# Patient Record
Sex: Female | Born: 1995 | Race: White | Hispanic: Yes | Marital: Married | Smoking: Former smoker
Health system: Southern US, Community
[De-identification: ages and names within clinical notes are randomized; demographics above are authoritative.]

## PROBLEM LIST (undated history)

## (undated) DIAGNOSIS — Z789 Other specified health status: Secondary | ICD-10-CM

## (undated) DIAGNOSIS — Z87440 Personal history of urinary (tract) infections: Secondary | ICD-10-CM

## (undated) DIAGNOSIS — A159 Respiratory tuberculosis unspecified: Secondary | ICD-10-CM

## (undated) HISTORY — DX: Personal history of urinary (tract) infections: Z87.440

## (undated) HISTORY — DX: Respiratory tuberculosis unspecified: A15.9

## (undated) HISTORY — DX: Other specified health status: Z78.9

## (undated) HISTORY — PX: OTHER SURGICAL HISTORY: SHX169

---

## 2019-01-27 ENCOUNTER — Other Ambulatory Visit: Payer: Self-pay | Admitting: Family Medicine

## 2019-01-27 ENCOUNTER — Ambulatory Visit (LOCAL_COMMUNITY_HEALTH_CENTER): Payer: Medicaid Other

## 2019-01-27 ENCOUNTER — Other Ambulatory Visit: Payer: Self-pay

## 2019-01-27 VITALS — BP 95/60 | Ht 64.0 in | Wt 143.0 lb

## 2019-01-27 DIAGNOSIS — Z3201 Encounter for pregnancy test, result positive: Secondary | ICD-10-CM

## 2019-01-27 LAB — PREGNANCY, URINE: Preg Test, Ur: POSITIVE — AB

## 2019-01-27 MED ORDER — PRENATAL VITAMINS 28-0.8 MG PO TABS: 28.0000 mg | ORAL_TABLET | Freq: Every day | ORAL | 0 refills | Status: DC

## 2019-01-27 NOTE — Progress Notes (Signed)
Patient reports abdominal pain since 3rd month of pregnancy.  Co to go to ED if pain worsened or other sx's arise. Patient sent to clerk to schedule MH IP. Aileen Fass, RN   No NCIR found

## 2019-02-04 ENCOUNTER — Ambulatory Visit: Payer: Medicaid Other | Admitting: Nurse Practitioner

## 2019-02-04 ENCOUNTER — Encounter: Payer: Self-pay | Admitting: Nurse Practitioner

## 2019-02-04 ENCOUNTER — Other Ambulatory Visit: Payer: Self-pay

## 2019-02-04 VITALS — BP 95/62 | Temp 97.9°F | Wt 142.6 lb

## 2019-02-04 DIAGNOSIS — Z3402 Encounter for supervision of normal first pregnancy, second trimester: Secondary | ICD-10-CM

## 2019-02-04 DIAGNOSIS — O0990 Supervision of high risk pregnancy, unspecified, unspecified trimester: Secondary | ICD-10-CM | POA: Diagnosis not present

## 2019-02-04 DIAGNOSIS — O0932 Supervision of pregnancy with insufficient antenatal care, second trimester: Secondary | ICD-10-CM

## 2019-02-04 LAB — WET PREP FOR TRICH, YEAST, CLUE: Trichomonas Exam: NEGATIVE

## 2019-02-04 LAB — URINALYSIS
Bilirubin, UA: NEGATIVE
Glucose, UA: NEGATIVE
Ketones, UA: NEGATIVE
Leukocytes,UA: NEGATIVE
Nitrite, UA: NEGATIVE
Protein,UA: NEGATIVE
RBC, UA: NEGATIVE
Specific Gravity, UA: 1.02 (ref 1.005–1.030)
Urobilinogen, Ur: 0.2 mg/dL (ref 0.2–1.0)
pH, UA: 7.5 (ref 5.0–7.5)

## 2019-02-04 LAB — HEMOGLOBIN, FINGERSTICK: Hemoglobin: 11.6 g/dL (ref 11.1–15.9)

## 2019-02-04 LAB — OB RESULTS CONSOLE HIV ANTIBODY (ROUTINE TESTING): HIV: NONREACTIVE

## 2019-02-04 NOTE — Progress Notes (Signed)
Presents for initiation of prenatal care. Reports in [place] x2 months from [place]. FOB remains in [place]. Negative responses to Covid-19 screening questions.Urine dip WNL and hgb = 11.6 meso interventions needed per standing order. [place] referral faxed to [facility] with confirmation received. Shona Needles, RN

## 2019-02-04 NOTE — Progress Notes (Signed)
[place] [id number] [phone]  INITIAL PRENATAL VISIT NOTE  Subjective:  Crystal Wright is a 23 y.o. G2P1001 at [redacted]w[redacted]d being seen today to start prenatal care at the [facility].  She is currently monitored for the following issues for this high-risk pregnancy and has Supervision of high risk pregnancy, antepartum and Insufficient prenatal care on their problem list.  Patient reports no complaints.  Contractions: Not present. Vag. Bleeding: None.  Movement: Present. Denies leaking of fluid.   The following portions of the patient's history were reviewed and updated as appropriate: allergies, current medications, past family history, past medical history, past social history, past surgical history and problem list. Problem list updated.  Objective:   Vitals:   02/04/19 1346  BP: 95/62  Temp: 97.9 F (36.6 C)  Weight: 142 lb 9.6 oz (64.7 kg)    Fetal Status: Fetal Heart Rate (bpm): 144 Fundal Height: 23 cm Movement: Present  Presentation: Brow  Physical Exam Vitals signs and nursing note reviewed.  Constitutional:      Appearance: Normal appearance.  HENT:     Head: Normocephalic.     Right Ear: Hearing normal.     Left Ear: Hearing normal.     Nose: Nose normal.     Mouth/Throat:     Lips: Pink.     Mouth: Mucous membranes are moist.  Eyes:     General: Lids are normal.     Extraocular Movements: Extraocular movements intact.  Neck:     Musculoskeletal: Full passive range of motion without pain, normal range of motion and neck supple.  Cardiovascular:     Rate and Rhythm: Normal rate and regular rhythm.     Pulses: Normal pulses.     Heart sounds: Normal heart sounds.  Pulmonary:     Effort: Pulmonary effort is normal.     Breath sounds: Normal breath sounds.     Comments: CBE completed - discussed monthly SBE's Client plans to  breastfeed/formula feed. Abdominal:     Palpations: Abdomen is soft.     Comments: Client pregnant -  ~ [redacted]wks  gestation   Genitourinary:    Rectum: Normal.  Musculoskeletal: Normal range of motion.     Right lower leg: No edema.     Left lower leg: No edema.  Lymphadenopathy:     Cervical: No cervical adenopathy.  Skin:    General: Skin is warm and dry.  Neurological:     Mental Status: She is alert and oriented to person, place, and time.  Psychiatric:        Attention and Perception: Attention normal.        Mood and Affect: Mood normal.        Speech: Speech normal.        Behavior: Behavior normal. Behavior is cooperative.        Cognition and Memory: Cognition and memory normal.    2 FB above umbilicus 259 FHR   Assessment and Plan:  Pregnancy: G2P1001 at [redacted]w[redacted]d  1. Encounter for supervision of normal first pregnancy in second trimester Client doing well  - WET PREP FOR [place], YEAST, CLUE - Urinalysis (Urine Dip) - Hemoglobin, venipuncture - Pap IG (Image Guided) - Urine Culture - HIV [facility] - Comprehensive metabolic panel - Chlamydia/Gonococcus/Trichomonas, NAA - [id number] 11+Oxyco+Alc+Crt-Bund - Lead, blood (adult age 109 yrs or greater)  2. Insufficient prenatal care in  second trimester Continue to monitor    Discussed overview of care and coordination with inpatient delivery practices including [facility], [facility], [facility] and [facility].   Reviewed Centering pregnancy as standard of care at [facility], oriented to room and showed video. Based on EDD, plan for Cycle  - discussed Zoom pregnancy .    Preterm labor symptoms and general obstetric precautions including but not limited to vaginal bleeding, contractions, leaking of fluid and fetal movement were reviewed in detail with the patient.  Please refer to After Visit Summary for other counseling recommendations.   Return in about 2 weeks (around 02/18/2019) for telehealth.  Future Appointments   Date Time Provider Department Center  02/18/2019  2:00 PM [facility] PROVIDER [facility] None    Donn PieriniKarla W Jaleyah Longhi, NP

## 2019-02-04 NOTE — Progress Notes (Signed)
Quit smoking 4 years ago

## 2019-02-05 DIAGNOSIS — O0990 Supervision of high risk pregnancy, unspecified, unspecified trimester: Secondary | ICD-10-CM | POA: Insufficient documentation

## 2019-02-05 DIAGNOSIS — O0930 Supervision of pregnancy with insufficient antenatal care, unspecified trimester: Secondary | ICD-10-CM | POA: Insufficient documentation

## 2019-02-06 LAB — 789231 7+OXYCODONE-BUND
Amphetamines, Urine: NEGATIVE ng/mL
BENZODIAZ UR QL: NEGATIVE ng/mL
Barbiturate screen, urine: NEGATIVE ng/mL
Cannabinoid Quant, Ur: NEGATIVE ng/mL
Cocaine (Metab.): NEGATIVE ng/mL
OPIATE SCREEN URINE: NEGATIVE ng/mL
Oxycodone/Oxymorphone, Urine: NEGATIVE ng/mL
PCP Quant, Ur: NEGATIVE ng/mL

## 2019-02-06 LAB — URINE CULTURE: Organism ID, Bacteria: NO GROWTH

## 2019-02-07 ENCOUNTER — Telehealth: Payer: Self-pay

## 2019-02-07 LAB — QUANTIFERON-TB GOLD PLUS
QuantiFERON Mitogen Value: 7.54 IU/mL
QuantiFERON Nil Value: 0.02 IU/mL
QuantiFERON TB1 Ag Value: 6.2 IU/mL
QuantiFERON TB2 Ag Value: 5.94 IU/mL
QuantiFERON-TB Gold Plus: POSITIVE — AB

## 2019-02-07 LAB — CBC/D/PLT+RPR+RH+ABO+RUB AB...
Antibody Screen: NEGATIVE
Basophils Absolute: 0 10*3/uL (ref 0.0–0.2)
Basos: 0 %
EOS (ABSOLUTE): 0.1 10*3/uL (ref 0.0–0.4)
Eos: 1 %
Hematocrit: 33.8 % — ABNORMAL LOW (ref 34.0–46.6)
Hemoglobin: 11.6 g/dL (ref 11.1–15.9)
Hepatitis B Surface Ag: NEGATIVE
Immature Grans (Abs): 0.1 10*3/uL (ref 0.0–0.1)
Immature Granulocytes: 1 %
Lymphocytes Absolute: 1.4 10*3/uL (ref 0.7–3.1)
Lymphs: 16 %
MCH: 31.3 pg (ref 26.6–33.0)
MCHC: 34.3 g/dL (ref 31.5–35.7)
MCV: 91 fL (ref 79–97)
Monocytes Absolute: 0.7 10*3/uL (ref 0.1–0.9)
Monocytes: 8 %
Neutrophils Absolute: 6.8 10*3/uL (ref 1.4–7.0)
Neutrophils: 74 %
Platelets: 246 10*3/uL (ref 150–450)
RBC: 3.71 x10E6/uL — ABNORMAL LOW (ref 3.77–5.28)
RDW: 13.1 % (ref 11.7–15.4)
RPR Ser Ql: NONREACTIVE
Rh Factor: POSITIVE
Rubella Antibodies, IGG: 1.59 index (ref >0.99)
Varicella zoster IgG: 306 index (ref >165)
WBC: 9.1 10*3/uL (ref 3.4–10.8)

## 2019-02-07 LAB — HGB FRAC. W/SOLUBILITY
Hgb A2 Quant: 2.6 % (ref 1.8–3.2)
Hgb A: 97.4 % (ref 96.4–98.8)
Hgb C: 0 %
Hgb F Quant: 0 % (ref 0.0–2.0)
Hgb S: 0 %
Hgb Solubility: NEGATIVE
Hgb Variant: 0 %

## 2019-02-07 LAB — COMPREHENSIVE METABOLIC PANEL
ALT: 10 IU/L (ref 0–32)
AST: 13 IU/L (ref 0–40)
Albumin/Globulin Ratio: 1.7 (ref 1.2–2.2)
Albumin: 3.7 g/dL — ABNORMAL LOW (ref 3.9–5.0)
Alkaline Phosphatase: 59 IU/L (ref 39–117)
BUN/Creatinine Ratio: 10 (ref 9–23)
BUN: 5 mg/dL — ABNORMAL LOW (ref 6–20)
Bilirubin Total: <0.2 mg/dL (ref 0.0–1.2)
CO2: 19 mmol/L — ABNORMAL LOW (ref 20–29)
Calcium: 8.7 mg/dL (ref 8.7–10.2)
Chloride: 105 mmol/L (ref 96–106)
Creatinine, Ser: 0.48 mg/dL — ABNORMAL LOW (ref 0.57–1.00)
GFR calc Af Amer: 162 mL/min/{1.73_m2} (ref >59)
GFR calc non Af Amer: 141 mL/min/{1.73_m2} (ref >59)
Globulin, Total: 2.2 g/dL (ref 1.5–4.5)
Glucose: 80 mg/dL (ref 65–99)
Potassium: 4.1 mmol/L (ref 3.5–5.2)
Sodium: 138 mmol/L (ref 134–144)
Total Protein: 5.9 g/dL — ABNORMAL LOW (ref 6.0–8.5)

## 2019-02-07 LAB — LEAD, BLOOD (ADULT >= 16 YRS): Lead-Whole Blood: 3 ug/dL (ref 0–4)

## 2019-02-09 LAB — PAP IG (IMAGE GUIDED): PAP Smear Comment: 0

## 2019-02-10 LAB — CHLAMYDIA/GC NAA, CONFIRMATION
Chlamydia trachomatis, NAA: NEGATIVE
Neisseria gonorrhoeae, NAA: NEGATIVE

## 2019-02-18 ENCOUNTER — Ambulatory Visit: Payer: Medicaid Other | Admitting: Nurse Practitioner

## 2019-02-18 ENCOUNTER — Other Ambulatory Visit: Payer: Self-pay

## 2019-02-18 DIAGNOSIS — O0932 Supervision of pregnancy with insufficient antenatal care, second trimester: Secondary | ICD-10-CM

## 2019-02-18 DIAGNOSIS — O0990 Supervision of high risk pregnancy, unspecified, unspecified trimester: Secondary | ICD-10-CM

## 2019-02-18 NOTE — Progress Notes (Signed)
TC to patient for maternity telehealth appointment. Patient at 5 1/7 today. Patient identified using 2 identifiers, and states she is at home and will be available for provider call for next 30-45 minutes. Patient scheduled for 28 week labs next visit. Explained to patient about routine prenatal labs and that she can expect to be in clinic for a little over 1 hour. Patient agrees to plans and is waiting for provider call.Jenetta Downer, RN

## 2019-02-18 NOTE — Progress Notes (Signed)
   TELEPHONE OBSTETRICS VISIT ENCOUNTER NOTE  I connected with@ on 02/18/19 at  2:00 PM EDT by telephone at home and verified that I am speaking with the correct person using two identifiers.   I discussed the limitations, risks, security and privacy concerns of performing an evaluation and management service by telephone and the availability of in person appointments. I also discussed with the patient that there may be a patient responsible charge related to this service. The patient expressed understanding and agreed to proceed.  Subjective:  Crystal Wright is a 23 y.o. G2P1001 at [redacted]w[redacted]d being followed for ongoing prenatal care.  She is currently monitored for the following issues for this high-risk pregnancy and has Supervision of high risk pregnancy, antepartum and Insufficient prenatal care on their problem list.  Patient reports c/o right "belly pain" since 3 months pregnant intermittently x 15-30 min alleviated with lying down, aggrevated with walking. Last pain yesterday and resolved spontaneously. Denies bleeding, denies SROM. Reports fetal movement. Denies any contractions, bleeding or leaking of fluid.   The following portions of the patient's history were reviewed and updated as appropriate: allergies, current medications, past family history, past medical history, past social history, past surgical history and problem list.   Objective:   General:  Alert, oriented and cooperative.   Mental Status: Normal mood and affect perceived. Normal judgment and thought content.  Rest of physical exam deferred due to type of encounter  Assessment and Plan:  Pregnancy: G2P1001 at [redacted]w[redacted]d 1. Supervision of high risk pregnancy, antepartum   Preterm labor symptoms and general obstetric precautions including but not limited to vaginal bleeding, contractions, leaking of fluid and fetal movement were reviewed in detail with the patient.  I discussed the assessment and treatment plan with the  patient. The patient was provided an opportunity to ask questions and all were answered. The patient agreed with the plan and demonstrated an understanding of the instructions. The patient was advised to call back or seek an in-person office evaluation/go to the hospital for any urgent or concerning symptoms.  Please refer to After Visit Summary for other counseling recommendations.   I provided 12 minutes of non-face-to-face time during this encounter.  No follow-ups on file.  Future Appointments  Date Time Provider [place]  03/14/2019 10:40 AM [facility] PROVIDER [facility] None    Herbie Saxon, CNM

## 2019-02-25 ENCOUNTER — Ambulatory Visit (LOCAL_COMMUNITY_HEALTH_CENTER): Payer: Medicaid Other

## 2019-02-25 VITALS — Wt 142.9 lb

## 2019-02-25 DIAGNOSIS — R7612 Nonspecific reaction to cell mediated immunity measurement of gamma interferon antigen response without active tuberculosis: Secondary | ICD-10-CM | POA: Insufficient documentation

## 2019-02-25 HISTORY — DX: Nonspecific reaction to cell mediated immunity measurement of gamma interferon antigen response without active tuberculosis: R76.12

## 2019-02-25 NOTE — Telephone Encounter (Signed)
TC with patient re: +QFT. See EPI telephone visit from 02/25/19 Aileen Fass, RN

## 2019-02-25 NOTE — Progress Notes (Signed)
Discussed +QFT with patient.  EPI completed completed via phone d/t covid 19. Discussed LTBI vs Active TB and medication. TB RN will f/u with patient once CXR result is complete. Aileen Fass, RN

## 2019-02-26 ENCOUNTER — Ambulatory Visit
Admission: RE | Admit: 2019-02-26 | Discharge: 2019-02-26 | Disposition: A | Discharge status: Home / Self Care | Payer: Self-pay | Source: Ambulatory Visit | Attending: Family Medicine | Admitting: Family Medicine

## 2019-02-26 ENCOUNTER — Other Ambulatory Visit: Payer: Self-pay | Admitting: Family Medicine

## 2019-02-26 DIAGNOSIS — R7612 Nonspecific reaction to cell mediated immunity measurement of gamma interferon antigen response without active tuberculosis: Secondary | ICD-10-CM

## 2019-02-27 ENCOUNTER — Other Ambulatory Visit: Payer: Self-pay

## 2019-02-27 ENCOUNTER — Telehealth: Payer: Self-pay

## 2019-02-27 DIAGNOSIS — R7612 Nonspecific reaction to cell mediated immunity measurement of gamma interferon antigen response without active tuberculosis: Secondary | ICD-10-CM

## 2019-02-27 MED ORDER — RIFAMPIN 300 MG PO CAPS: 600.0000 mg | ORAL_CAPSULE | Freq: Every day | ORAL | 3 refills | Status: DC

## 2019-02-27 NOTE — Progress Notes (Signed)
Tuberculosis treatment orders  All patients are to be monitored per [place] and county TB policies.   Patient has latent TB. 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 Ernestina Patches.

## 2019-02-27 NOTE — Telephone Encounter (Signed)
TC with patient re: normal CXR. Discussed LTBI tx and patient wishes to proceed with tx. Appt scheduled for 03/14/19 for Rifampin start. Aileen Fass, RN

## 2019-03-14 ENCOUNTER — Encounter: Payer: Self-pay | Admitting: Advanced Practice Midwife

## 2019-03-14 ENCOUNTER — Other Ambulatory Visit: Payer: Self-pay

## 2019-03-14 ENCOUNTER — Ambulatory Visit: Payer: Self-pay | Admitting: Advanced Practice Midwife

## 2019-03-14 ENCOUNTER — Ambulatory Visit (LOCAL_COMMUNITY_HEALTH_CENTER): Payer: Self-pay

## 2019-03-14 VITALS — Wt 150.0 lb

## 2019-03-14 VITALS — BP 92/55 | Temp 98.7°F | Wt 150.4 lb

## 2019-03-14 DIAGNOSIS — O0932 Supervision of pregnancy with insufficient antenatal care, second trimester: Secondary | ICD-10-CM

## 2019-03-14 DIAGNOSIS — R7612 Nonspecific reaction to cell mediated immunity measurement of gamma interferon antigen response without active tuberculosis: Secondary | ICD-10-CM

## 2019-03-14 DIAGNOSIS — O0990 Supervision of high risk pregnancy, unspecified, unspecified trimester: Secondary | ICD-10-CM

## 2019-03-14 LAB — HEMOGLOBIN, FINGERSTICK: Hemoglobin: 11.6 g/dL (ref 11.1–15.9)

## 2019-03-14 LAB — HIV ANTIBODY (ROUTINE TESTING W REFLEX): HIV 1&2 Ab, 4th Generation: NONREACTIVE

## 2019-03-14 NOTE — Progress Notes (Signed)
   PRENATAL VISIT NOTE  Subjective:  Crystal Wright is a 23 y.o. G2P1001 at [redacted]w[redacted]d being seen today for ongoing prenatal care.  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  Patient reports no complaints.  Contractions: Not present. Vag. Bleeding: None.  Movement: Present. Denies leaking of fluid/ROM.   The following portions of the patient's history were reviewed and updated as appropriate: allergies, current medications, past family history, past medical history, past social history, past surgical history and problem list. Problem list updated.  Objective:   Vitals:   03/14/19 1056  BP: (!) 92/55  Temp: 98.7 F (37.1 C)  Weight: 150 lb 6.4 oz (68.2 kg)    Fetal Status: Fetal Heart Rate (bpm): 150 Fundal Height: 26 cm Movement: Present     General:  Alert, oriented and cooperative. Patient is in no acute distress.  Skin: Skin is warm and dry. No rash noted.   Cardiovascular: Normal heart rate noted  Respiratory: Normal respiratory effort, no problems with respiration noted  Abdomen: Soft, gravid, appropriate for gestational age.  Pain/Pressure: Absent     Pelvic: Cervical exam deferred        Extremities: Normal range of motion.  Edema: None  Mental Status: Normal mood and affect. Normal behavior. Normal judgment and thought content.   Assessment and Plan:  Pregnancy: G2P1001 at [redacted]w[redacted]d  1. Supervision of high risk pregnancy, antepartum Feels well. Info given to assist with choosing peds (has no peds for 23 yo).  To begin LTBI tx per Aileen Fass, RN. 28 wk labs today.  Desires Nexplanon pp for birth control.   - Hepatic function panel - Hemoglobin, venipuncture - Glucose, 1 hour gestational - HIV [place]  [facility]  2. Insufficient prenatal care in second trimester    Preterm labor symptoms and general obstetric precautions including but not limited to vaginal bleeding, contractions, leaking of fluid and fetal movement were reviewed in detail with the patient. Please refer to After Visit Summary for other counseling recommendations.  Return in about 2 weeks (around 03/28/2019) for routine PNC.  No future appointments.  Herbie Saxon, CNM

## 2019-03-14 NOTE — Addendum Note (Signed)
Addended by: Jenetta Downer on: 03/14/2019 12:21 PM   Modules accepted: Orders

## 2019-03-14 NOTE — Progress Notes (Signed)
Patient Hgb reviewed, no treatment needed at this time. Tdap given, right deltoid, tolerated well, VIS given. Patient scheduled for 2 week telehealth appointment and given reminder card. Patient states understanding that she needs to be home and available at appointment time.Jenetta Downer, RN

## 2019-03-14 NOTE — Progress Notes (Signed)
Patient here for maternity visit at 28 4/7. Needs to be to lab by 11:50, and also needs to talk with M. Dorminy today about TB meds.Jenetta Downer, RN

## 2019-03-15 LAB — HEPATIC FUNCTION PANEL
ALT: 9 IU/L (ref 0–32)
AST: 13 IU/L (ref 0–40)
Albumin: 3.4 g/dL — ABNORMAL LOW (ref 3.9–5.0)
Alkaline Phosphatase: 91 IU/L (ref 39–117)
Bilirubin Total: <0.2 mg/dL (ref 0.0–1.2)
Bilirubin, Direct: 0.06 mg/dL (ref 0.00–0.40)
Total Protein: 5.7 g/dL — ABNORMAL LOW (ref 6.0–8.5)

## 2019-03-15 LAB — RPR: RPR Ser Ql: NONREACTIVE

## 2019-03-15 LAB — GLUCOSE, 1 HOUR GESTATIONAL: Gestational Diabetes Screen: 84 mg/dL (ref 65–139)

## 2019-03-19 NOTE — Progress Notes (Signed)
Rifampin 300mg #60 dispensed per [place] order. Elizabth Palka, RN  

## 2019-03-21 MED ORDER — RIFAMPIN 300 MG PO CAPS: 600.0000 mg | ORAL_CAPSULE | Freq: Every day | ORAL | 0 refills | Status: AC

## 2019-03-24 ENCOUNTER — Telehealth: Payer: Self-pay

## 2019-03-24 NOTE — Telephone Encounter (Signed)
TC from patient.  Reports nausea and vomiting since started taking Rifampin. Had [facility] appt on 03/14/19.  States pregnancy nausea had improved before starting Rifampin. Instructed patient to not take any more Rifampin until speaking with TB RN. TB RN needs to consult with Dr. Ernestina Patches.  Instructed small sips of fluids and small meals/snacks if patient is able to eat. Aileen Fass, RN

## 2019-03-26 NOTE — Addendum Note (Signed)
Addended by: Cletis Media on: 03/26/2019 02:40 PM   Modules accepted: Orders

## 2019-03-26 NOTE — Telephone Encounter (Signed)
Recommend LFTs. Follow up results

## 2019-03-26 NOTE — Telephone Encounter (Signed)
TC with patient. Per [facility] patient needs LFTs. Appt scheduled for 03/27/19 Aileen Fass, RN

## 2019-03-27 ENCOUNTER — Other Ambulatory Visit (LOCAL_COMMUNITY_HEALTH_CENTER): Payer: Self-pay

## 2019-03-27 ENCOUNTER — Other Ambulatory Visit: Payer: Self-pay

## 2019-03-27 DIAGNOSIS — R7612 Nonspecific reaction to cell mediated immunity measurement of gamma interferon antigen response without active tuberculosis: Secondary | ICD-10-CM

## 2019-03-27 NOTE — Progress Notes (Signed)
Patient in clinic today for LFTs. Patient previously called TB RN and stated that she was vomiting everyday from Rifampin. When patient called a few days ago she was instructed to stop taking the Rifampin and scheduled for LFTs per [facility]. TB RN will f/u with patient once results are complete.Aileen Fass, RN

## 2019-03-28 ENCOUNTER — Other Ambulatory Visit: Payer: Self-pay

## 2019-03-28 ENCOUNTER — Ambulatory Visit: Payer: Self-pay | Admitting: Family Medicine

## 2019-03-28 DIAGNOSIS — O0990 Supervision of high risk pregnancy, unspecified, unspecified trimester: Secondary | ICD-10-CM

## 2019-03-28 DIAGNOSIS — O219 Vomiting of pregnancy, unspecified: Secondary | ICD-10-CM

## 2019-03-28 DIAGNOSIS — O0933 Supervision of pregnancy with insufficient antenatal care, third trimester: Secondary | ICD-10-CM

## 2019-03-28 DIAGNOSIS — R7612 Nonspecific reaction to cell mediated immunity measurement of gamma interferon antigen response without active tuberculosis: Secondary | ICD-10-CM

## 2019-03-28 LAB — HEPATIC FUNCTION PANEL
ALT: 21 IU/L (ref 0–32)
AST: 19 IU/L (ref 0–40)
Albumin: 3.2 g/dL — ABNORMAL LOW (ref 3.9–5.0)
Alkaline Phosphatase: 126 IU/L — ABNORMAL HIGH (ref 39–117)
Bilirubin Total: <0.2 mg/dL (ref 0.0–1.2)
Bilirubin, Direct: 0.08 mg/dL (ref 0.00–0.40)
Total Protein: 5.4 g/dL — ABNORMAL LOW (ref 6.0–8.5)

## 2019-03-28 NOTE — Progress Notes (Signed)
TELEPHONE OBSTETRICS VISIT ENCOUNTER NOTE  I connected with@ on 03/28/19 at 11:00 AM EDT by telephone at home and verified that I am speaking with the correct person using two identifiers.   I discussed the limitations, risks, security and privacy concerns of performing an evaluation and management service by telephone and the availability of in person appointments. I also discussed with the patient that there may be a patient responsible charge related to this service. The patient expressed understanding and agreed to proceed.  Subjective:  Crystal Wright is a 23 y.o. G2P1001 at [redacted]w[redacted]d being followed for ongoing prenatal care.  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  Patient reports no complaints. Reports fetal movement. Denies any contractions, bleeding or leaking of fluid.   The following portions of the patient's history were reviewed and updated as appropriate: allergies, current medications, past family history, past medical history, past social history, past surgical history and problem list.   Objective:   General:  Alert, oriented and cooperative.   Mental Status: Normal mood and affect perceived. Normal judgment and thought content.  Rest of physical exam deferred due to type of encounter  Assessment and Plan:  Pregnancy: G2P1001 at [redacted]w[redacted]d 1. Supervision of high risk pregnancy, antepartum Up to date Needs BP cuff and scale at home   2. Insufficient prenatal care in third trimester Up to date, will monitor   3. Nausea/vomiting- reports this had improved. Patient stopped rifampin with these sx and had LFT yesterday.  I have reviewed the LFTs which are WNL. TB RN aware and will reach out to the patient to  restart medication.   Preterm labor symptoms and general obstetric precautions including but not limited to vaginal bleeding, contractions, leaking of fluid and fetal movement were reviewed in detail with the patient.  I discussed the assessment and treatment plan with the patient. The patient was provided an opportunity to ask questions and all were answered. The patient agreed with the plan and demonstrated an understanding of the instructions. The patient was advised to call back or seek an in-person office evaluation/go to the hospital for any urgent or concerning symptoms.  Please refer to After Visit Summary for other counseling recommendations.   I provided 10 minutes of non-face-to-face time during this encounter.  Return in about 2 weeks (around 04/11/2019) for Routine prenatal care.  Future Appointments  Date Time Provider [place]  04/11/2019 10:20 AM [facility] PROVIDER [facility] None    Caren Macadam, MD

## 2019-03-28 NOTE — Progress Notes (Signed)
TC to patient for maternity telehealth appointment at 30 3/7. Patient states she is at home and verified her address. Patient unable to take VS at home. Patient scheduled to come to clinic for next appointment in 2 weeks. Patient states she will be available by phone for provider call for next 30 minutes.Jenetta Downer, RN

## 2019-04-07 ENCOUNTER — Telehealth: Payer: Self-pay

## 2019-04-07 NOTE — Telephone Encounter (Signed)
TC with patient re: LFTs and nausea from Rifampin.  Informed labs normal and offered for her to restart Rifampin.  Patient states d/t lots of nausea caused by Rifampin she wants to wait until after the baby is born to possibly restart. TB RN will f/u with patient after delivery Aileen Fass, RN   Interpreter- V. Olmedo

## 2019-04-11 ENCOUNTER — Ambulatory Visit: Payer: Self-pay | Admitting: Advanced Practice Midwife

## 2019-04-11 ENCOUNTER — Other Ambulatory Visit: Payer: Self-pay

## 2019-04-11 DIAGNOSIS — O0990 Supervision of high risk pregnancy, unspecified, unspecified trimester: Secondary | ICD-10-CM

## 2019-04-11 DIAGNOSIS — Z23 Encounter for immunization: Secondary | ICD-10-CM

## 2019-04-11 MED ORDER — PRENATAL VITAMINS 28-0.8 MG PO TABS: 28.0000 mg | ORAL_TABLET | Freq: Every day | ORAL | 0 refills | Status: AC

## 2019-04-11 NOTE — Progress Notes (Signed)
In for visit; denies hospital visit; taking PNV-more given; desires flu vaccine today Debera Lat, RN

## 2019-04-11 NOTE — Progress Notes (Signed)
   PRENATAL VISIT NOTE  Subjective:  Crystal Wright is a 23 y.o. G2P1001 at [redacted]w[redacted]d being seen today for ongoing prenatal care.  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  Patient reports no complaints.  Contractions: Not present. Vag. Bleeding: None.  Movement: Present. Denies leaking of fluid/ROM.   The following portions of the patient's history were reviewed and updated as appropriate: allergies, current medications, past family history, past medical history, past social history, past surgical history and problem list. Problem list updated.  Objective:   Vitals:   04/11/19 1020  BP: 95/62  Temp: (!) 97.2 F (36.2 C)  Weight: 154 lb 12.8 oz (70.2 kg)    Fetal Status: Fetal Heart Rate (bpm): 140 Fundal Height: 31 cm Movement: Present     General:  Alert, oriented and cooperative. Patient is in no acute distress.  Skin: Skin is warm and dry. No rash noted.   Cardiovascular: Normal heart rate noted  Respiratory: Normal respiratory effort, no problems with respiration noted  Abdomen: Soft, gravid, appropriate for gestational age.  Pain/Pressure: Absent     Pelvic: Cervical exam deferred        Extremities: Normal range of motion.  Edema: None  Mental Status: Normal mood and affect. Normal behavior. Normal judgment and thought content.   Assessment and Plan:  Pregnancy: G2P1001 at [redacted]w[redacted]d  1. Supervision of high risk pregnancy, antepartum Not working.  Walks 7x/wk x 1 hr.  Plans to breast feed and plans Nexplanon pp for birth control.  No Peds for her 23 yo - Prenatal Vit-Fe Fumarate-FA (PRENATAL VITAMINS) 28-0.8 MG TABS; Take 28 mg by mouth daily.  Dispense: 90 tablet; Refill: 0   Preterm labor symptoms and  general obstetric precautions including but not limited to vaginal bleeding, contractions, leaking of fluid and fetal movement were reviewed in detail with the patient. Please refer to After Visit Summary for other counseling recommendations.  Return in about 2 weeks (around 04/25/2019).  No future appointments.  Herbie Saxon, CNM

## 2019-04-15 NOTE — Addendum Note (Signed)
Addended by: Cletis Media on: 04/15/2019 01:17 PM   Modules accepted: Orders

## 2019-04-21 ENCOUNTER — Telehealth: Payer: Self-pay | Admitting: Family Medicine

## 2019-04-21 ENCOUNTER — Telehealth: Payer: Self-pay

## 2019-04-21 NOTE — Telephone Encounter (Signed)
Patient state she is having pain since yesterday and not feeling well.

## 2019-04-21 NOTE — Telephone Encounter (Signed)
Client with c/o persistent cramping below umbilicus that began yesterday am between 0300 - 0400 and stopped spontaneously last night between 2200 - 2300. During cramping, client states "felt bad." Client denies vaginal bleeding, leaking of fluid via vagina or change in vaginal discharge. Reports good fetal movement as usual. Denies UTI symptoms. Client states was at home all day on Saturday. Cramping has not recurred since ended last pm. Consult with Dr. Ernestina Patches and client counseled to keep [facility] appt as scheduled for 04/25/2019 and if above symptoms recur to seek hospital evaluation. Client verbalized undertanding of above. Rowland Lathe interpreted during call. Rich Number, RN

## 2019-04-22 NOTE — Telephone Encounter (Signed)
Attestation of Medical Director for clinical support staff: I agree with the care provided to this patient and was available for any consultation.  I was consulted at the point of care and documentation reflects my recommendations.  Aleesa Sweigert Niles Cass Vandermeulen, MD, MPH, ABFM [facility] Medical Director  

## 2019-04-25 ENCOUNTER — Other Ambulatory Visit: Payer: Self-pay

## 2019-04-25 ENCOUNTER — Ambulatory Visit: Payer: Self-pay | Admitting: Advanced Practice Midwife

## 2019-04-25 DIAGNOSIS — O0990 Supervision of high risk pregnancy, unspecified, unspecified trimester: Secondary | ICD-10-CM

## 2019-04-25 DIAGNOSIS — R7611 Nonspecific reaction to tuberculin skin test without active tuberculosis: Secondary | ICD-10-CM | POA: Insufficient documentation

## 2019-04-25 DIAGNOSIS — O43102 Malformation of placenta, unspecified, second trimester: Secondary | ICD-10-CM

## 2019-04-25 DIAGNOSIS — O43109 Malformation of placenta, unspecified, unspecified trimester: Secondary | ICD-10-CM | POA: Insufficient documentation

## 2019-04-25 DIAGNOSIS — O0933 Supervision of pregnancy with insufficient antenatal care, third trimester: Secondary | ICD-10-CM

## 2019-04-25 HISTORY — DX: Nonspecific reaction to tuberculin skin test without active tuberculosis: R76.11

## 2019-04-25 NOTE — Progress Notes (Signed)
   PRENATAL VISIT NOTE  Subjective:  Crystal Wright is a 23 y.o. G2P1001 at [redacted]w[redacted]d being seen today for ongoing prenatal care.  She is currently monitored for the following issues for this high-risk pregnancy and has Supervision of high risk pregnancy, antepartum; Insufficient prenatal care; Nonspecific reaction to cell mediated immunity measurement of gamma interferon antigen response without active tuberculosis; and Positive skin test for tuberculosis chest xray wnl on their problem list.  Patient reports no complaints.  Contractions: Not present. Vag. Bleeding: None.  Movement: Present. Denies leaking of fluid/ROM.   The following portions of the patient's history were reviewed and updated as appropriate: allergies, current medications, past family history, past medical history, past social history, past surgical history and problem list. Problem list updated.  Objective:   Vitals:   04/25/19 1100  BP: 101/64  Temp: (!) 97.2 F (36.2 C)  Weight: 155 lb 6.4 oz (70.5 kg)    Fetal Status: Fetal Heart Rate (bpm): 160 Fundal Height: 30 cm Movement: Present     General:  Alert, oriented and cooperative. Patient is in no acute distress.  Skin: Skin is warm and dry. No rash noted.   Cardiovascular: Normal heart rate noted  Respiratory: Normal respiratory effort, no problems with respiration noted  Abdomen: Soft, gravid, appropriate for gestational age.  Pain/Pressure: Absent     Pelvic: Cervical exam deferred        Extremities: Normal range of motion.  Edema: None  Mental Status: Normal mood and affect. Normal behavior. Normal judgment and thought content.   Assessment and Plan:  Pregnancy: G2P1001 at [redacted]w[redacted]d  1. Supervision of high risk pregnancy, antepartum S<D at 34 4/7.  23 lb 6.4 oz (10.6 kg).  Not working.  Last u/s 02/10/19 U/S ordered   2. Insufficient prenatal care in third trimester   3. Positive skin test for tuberculosis chest xray wnl Chest x ray wnl.  LTBI  ordered 02/27/19 and pt stopped Rifampin due to nausea [place] after delivery   Preterm labor symptoms and general obstetric precautions including but not limited to vaginal bleeding, contractions, leaking of fluid and fetal movement were reviewed in detail with the patient. Please refer to After Visit Summary for other counseling recommendations.  No follow-ups on file.  No future appointments.  Herbie Saxon, CNM

## 2019-04-25 NOTE — Progress Notes (Signed)
Faxed U/S referral form to [facility] for repeat U/S, confirmation received.

## 2019-04-25 NOTE — Progress Notes (Signed)
Taking PNV. Denies visits to ER since last visit to [facility].

## 2019-05-09 ENCOUNTER — Ambulatory Visit: Payer: Self-pay | Admitting: Family Medicine

## 2019-05-09 ENCOUNTER — Other Ambulatory Visit: Payer: Self-pay

## 2019-05-09 VITALS — BP 112/76 | Temp 97.9°F | Wt 158.6 lb

## 2019-05-09 DIAGNOSIS — O26843 Uterine size-date discrepancy, third trimester: Secondary | ICD-10-CM

## 2019-05-09 DIAGNOSIS — O0933 Supervision of pregnancy with insufficient antenatal care, third trimester: Secondary | ICD-10-CM

## 2019-05-09 DIAGNOSIS — O0990 Supervision of high risk pregnancy, unspecified, unspecified trimester: Secondary | ICD-10-CM

## 2019-05-09 NOTE — Progress Notes (Signed)
   PRENATAL VISIT NOTE  Subjective:  Crystal Wright is a 23 y.o. G2P1001 at [redacted]w[redacted]d being seen today for ongoing prenatal care.  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  Patient reports no complaints.  Contractions: Not present. Vag. Bleeding: None.  Movement: Present. Denies leaking of fluid/ROM.   The following portions of the patient's history were reviewed and updated as appropriate: allergies, current medications, past family history, past medical history, past social history, past surgical history and problem list. Problem list updated.  Objective:   Vitals:   05/09/19 1110  BP: 112/76  Temp: 97.9 F (36.6 C)  Weight: 158 lb 9.6 oz (71.9 kg)    Fetal Status: Fetal Heart Rate (bpm): 150 Fundal Height: 31 cm Movement: Present  Presentation: Vertex  General:  Alert, oriented and cooperative. Patient is in no acute distress.  Skin: Skin is warm and dry. No rash noted.   Cardiovascular: Normal heart rate noted  Respiratory: Normal respiratory effort, no problems with respiration noted  Abdomen: Soft, gravid, appropriate for gestational age.  Pain/Pressure: Absent     Pelvic: Cervical exam deferred        Extremities: Normal range of motion.  Edema: None  Mental Status: Normal mood and affect. Normal behavior. Normal judgment and thought content.   Assessment and Plan:  Pregnancy: G2P1001 at [redacted]w[redacted]d  1. Supervision of high risk pregnancy, antepartum Up to date, self collected today - Strep Gp B NAA - Chlamydia/GC NAA, Confirmation  2. Uterine size date discrepancy pregnancy, third  trimester Size< dates, significant.  [place] ordered @[facility]   3. Insufficient prenatal care in third trimester  Orders Placed This Encounter  Procedures  . Strep Gp B NAA  . [place] NAA, Confirmation    Preterm labor symptoms and general obstetric precautions including but not limited to vaginal bleeding, contractions, leaking of fluid and fetal movement were reviewed in detail with the patient. Please refer to After Visit Summary for other counseling recommendations.   Return in about 1 week (around 05/16/2019) for Routine prenatal care, in person.  Future Appointments  Date Time Provider [place]  05/16/2019 11:00 AM [facility] PROVIDER [facility] None    Caren Macadam, MD

## 2019-05-09 NOTE — Progress Notes (Signed)
Patient here for MH RV at 36 4/7. 36 week labs today, 36 week packet given. Patient self-collecting labs. Jenetta Downer, RN

## 2019-05-13 LAB — CHLAMYDIA/GC NAA, CONFIRMATION
Chlamydia trachomatis, NAA: NEGATIVE
Neisseria gonorrhoeae, NAA: NEGATIVE

## 2019-05-13 LAB — CULTURE, BETA STREP (GROUP B ONLY): Strep Gp B Culture: NEGATIVE

## 2019-05-16 ENCOUNTER — Ambulatory Visit: Payer: Self-pay

## 2019-05-17 DIAGNOSIS — Z227 Latent tuberculosis: Secondary | ICD-10-CM | POA: Insufficient documentation

## 2019-05-17 HISTORY — DX: Latent tuberculosis: Z22.7

## 2019-05-19 MED ORDER — GENERIC EXTERNAL MEDICATION
Status: DC
Start: ? — End: 2019-05-19

## 2019-05-19 MED ORDER — DIPHENHYDRAMINE HCL 25 MG PO CAPS
25.00 | ORAL_CAPSULE | ORAL | Status: DC
Start: ? — End: 2019-05-19

## 2019-05-19 MED ORDER — ACETAMINOPHEN 325 MG PO TABS
650.00 | ORAL_TABLET | ORAL | Status: DC
Start: ? — End: 2019-05-19

## 2019-05-19 MED ORDER — ONDANSETRON 4 MG PO TBDP
4.00 | ORAL_TABLET | ORAL | Status: DC
Start: ? — End: 2019-05-19

## 2019-05-19 MED ORDER — PNV PRENATAL PLUS MULTIVITAMIN 27-1 MG PO TABS
1.00 | ORAL_TABLET | ORAL | Status: DC
Start: 2019-05-18 — End: 2019-05-19

## 2019-05-19 MED ORDER — DIPHENHYDRAMINE HCL 50 MG/ML IJ SOLN
25.00 | INTRAMUSCULAR | Status: DC
Start: ? — End: 2019-05-19

## 2019-06-26 ENCOUNTER — Ambulatory Visit: Payer: Self-pay | Admitting: Physician Assistant

## 2019-06-26 ENCOUNTER — Other Ambulatory Visit: Payer: Self-pay

## 2019-06-26 ENCOUNTER — Encounter: Payer: Self-pay | Admitting: Family Medicine

## 2019-06-26 DIAGNOSIS — R7612 Nonspecific reaction to cell mediated immunity measurement of gamma interferon antigen response without active tuberculosis: Secondary | ICD-10-CM

## 2019-06-26 DIAGNOSIS — Z30017 Encounter for initial prescription of implantable subdermal contraceptive: Secondary | ICD-10-CM

## 2019-06-26 DIAGNOSIS — R7611 Nonspecific reaction to tuberculin skin test without active tuberculosis: Secondary | ICD-10-CM

## 2019-06-26 LAB — HEMOGLOBIN, FINGERSTICK: Hemoglobin: 14 g/dL (ref 11.1–15.9)

## 2019-06-26 MED ORDER — ETONOGESTREL 68 MG ~~LOC~~ IMPL
68.0000 mg | DRUG_IMPLANT | Freq: Once | SUBCUTANEOUS | Status: AC
  Administered 2019-06-26: 68 mg via SUBCUTANEOUS

## 2019-06-26 NOTE — Progress Notes (Signed)
Post Partum Exam  Crystal Wright is a 23 y.o. G70P1001 female who presents for a postpartum visit. She is 6 weeks postpartum following a spontaneous vaginal delivery after IOL for IUGR. I have fully reviewed the prenatal and intrapartum course. The delivery was at 37 4/7 gestational weeks. Anesthesia: epidural and general. Postpartum course has been uneventful. Baby's course has been complicated by SGA, birthweight 4 lb, stayed 5 days in hosp. Now thriving. Baby is feeding by Breast and Breast & formula Bleeding no bleeding. Bowel function is normal. Bladder function is normal. Patient is not sexually active. Contraception method is abstinence.  Postpartum depression screening:      Edinburgh Postnatal Depression Scale - 06/26/19 1020             Edinburgh Postnatal Depression Scale: In the Past 7 Days   I have been able to laugh and see the funny side of things. 0    I have looked forward with enjoyment to things. 0    I have blamed myself unnecessarily when things went wrong. 0    I have been anxious or worried for no good reason. 0    I have felt scared or panicky for no good reason. 0    Things have been getting on top of me. 0    I have been so unhappy that I have had difficulty sleeping. 0    I have felt sad or miserable. 0    I have been so unhappy that I have been crying. 0    The thought of harming myself has occurred to me. 0    Edinburgh Postnatal Depression Scale Total 0         Last pap smear done 01/2019 and was Normal  Review of Systems  A comprehensive review of systems was negative.  Objective:  BP 107/61  Temp (!) 97.5 F (36.4 C)  Ht 5\' 4"  (1.626 m)  Wt 159 lb 3.2 oz (72.2 kg)  LMP 08/26/2018 (Exact Date)  Breastfeeding Yes  BMI 27.33 kg/m  Gen: well appearing, NAD  HEENT: no scleral icterus  CV: RR Lung: Normal WOB  Breast:performed-yes  Ext: warm well perfused  GU: external genitalia without lesion,swelling, inguinal LAD. Vagina without  discharge. Cervix parous, nontender, no lesions. Uterus 6 wk size, nontender. Adnexa without tenderness or mass.  Rectal: performed - yes  Assessment:  Normal postpartum exam. Pap smear not done at today's visit.  Plan:  1. Contraception: Contraception counseling: Reviewed all forms of birth control options in the tiered based approach. available including abstinence; over the counter/barrier methods; hormonal contraceptive medication including pill, patch, ring, injection,contraceptive implant; hormonal and nonhormonal IUDs; permanent sterilization options including vasectomy and the various tubal sterilization modalities. Risks, benefits, and typical effectiveness rates were reviewed. Questions were answered. Written information was also given to the patient to review. Patient desires Nexplanon, this was inserted for patient. She will follow up in 1 year for surveillance. She was told to call with any further questions, or with any concerns about this method of contraception. Emphasized use of condoms 100% of the time for STI prevention.  Nexplanon Insertion Procedure  Patient identified, informed consent performed, consent signed. Patient does understand that irregular bleeding is a very common side effect of this medication. She was advised to have backup contraception after placement. Patient was determined to meet WHO criteria for not being pregnant. Appropriate time out taken. The insertion site was identified 8-10 cm (3-4 inches) from the medial epicondyle of  the humerus and 3-5 cm (1.25-2 inches) posterior to (below) the sulcus (groove) between the biceps and triceps muscles of the patient's left arm and marked. Patient was prepped with alcohol swab and then injected with 3 ml of 1% lidocaine. Arm was prepped with chlorhexidene, Nexplanon removed from packaging, Device confirmed in needle, then inserted full length of needle and withdrawn per handbook instructions. Nexplanon was able to palpated in  the patient's arm; patient palpated the insert herself. There was minimal blood loss. Patient insertion site covered with guaze and a pressure bandage to reduce any bruising. The patient tolerated the procedure well and was given post procedure instructions.  Nexplanon:  Counseled patient to take OTC analgesic starting as soon as lidocaine starts to wear off and take regularly for at least 48 hr to decrease discomfort. Specifically to take with food or milk to decrease stomach upset and for IB 600 mg (3 tablets) every 6 hrs; IB 800 mg (4 tablets) every 8 hrs; or Aleve 2 tablets every 12 hrs.  Patient was not offered ECP, as it was not indicated today.  Recommended delay in pregnancy for 18 months  2. Infant feeding: patient is currently feeding with breastmilk and Enfamil formula by bottle.  -Recommended patient engage with WIC/BFpeer counselors  -Counseled to sign new child up for [facility] services  3. Mood: EPDS is low risk. Reviewed resources and that mood sx in first year after pregnancy are considered related to pregnancy and to reach out for help at [facility] if needed. Discussed [facility] as link to care and availability of LCSW for counseling  4. Chronic Medical Conditions:  LTBI list chronic medical problems and follow up/management plan.  1. Postpartum exam  WNL, see plan above. HIV/Syphilis testing and hgb today.  2. 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 LFTs drawn today.  3. 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  Patient given handout about PCP care in the community  Given MVI per family planning program guidelines and availability  Follow up in: 12 months or as needed.

## 2019-06-26 NOTE — Progress Notes (Deleted)
[place] Partum Exam  [place] Delma Officer is a 23 y.o. G33P1001 female who presents for a postpartum visit. She is 6 weeks postpartum following a spontaneous vaginal delivery after IOL for IUGR. I have fully reviewed the prenatal and intrapartum course. The delivery was at 37 4/7 gestational weeks.  Anesthesia: epidural and general. Postpartum course has been uneventful. Baby's course has been complicated by SGA, birthweight 4 lb, stayed 5 days in hosp. Now thriving. Baby is feeding by Breast and Breast & formula Bleeding no bleeding. Bowel function is normal. Bladder function is normal. Patient is not sexually active. Contraception method is abstinence.   Postpartum depression screening: Edinburgh Postnatal Depression Scale - 06/26/19 1020      Edinburgh Postnatal Depression Scale:  In the Past 7 Days   I have been able to laugh and see the funny side of things.  0    I have looked forward with enjoyment to things.  0    I have blamed myself unnecessarily when things went wrong.  0    I have been anxious or worried for no good reason.  0    I have felt scared or panicky for no good reason.  0    Things have been getting on top of me.  0    I have been so unhappy that I have had difficulty sleeping.  0    I have felt sad or miserable.  0    I have been so unhappy that I have been crying.  0    The thought of harming myself has occurred to me.  0    Edinburgh Postnatal Depression Scale Total  0        {Common ambulatory SmartLinks:19316} Last pap smear done 01/2019 and was Normal  Review of Systems A comprehensive review of systems was negative.    Objective:  BP 107/61   Temp (!) 97.5 F (36.4 C)   Ht 5\' 4"  (1.626 m)   Wt 159 lb 3.2 oz (72.2 kg)   LMP 08/26/2018 (Exact Date)   Breastfeeding Yes   BMI 27.33 kg/m   Gen: well appearing, NAD HEENT: no scleral icterus CV: RR Lung: Normal WOB Breast:performed-yes  Ext: warm well perfused  GU: external genitalia without  lesion,swelling, inguinal LAD. Vagina without discharge. Cervix parous, nontender, no lesions. Uterus 6 wk size, nontender. Adnexa without tenderness or mass.   Rectal: performed -  yes       Assessment:   Normal postpartum exam. Pap smear not done at today's visit.   Plan:   1. Contraception: Contraception counseling: Reviewed all forms of birth control options in the tiered based approach. available including abstinence; over the counter/barrier methods; hormonal contraceptive medication including pill, patch, ring, injection,contraceptive implant; hormonal and nonhormonal IUDs; permanent sterilization options including vasectomy and the various tubal sterilization modalities. Risks, benefits, and typical effectiveness rates were reviewed.  Questions were answered.  Written information was also given to the patient to review.  Patient desires Nexplanon, this was inserted for patient. She will follow up in  1 year for surveillance.  She was told to call with any further questions, or with any concerns about this method of contraception.  Emphasized use of condoms 100% of the time for STI prevention.  Nexplanon Insertion Procedure Patient identified, informed consent performed, consent signed.   Patient does understand that irregular bleeding is a very common side effect of this medication. She was advised to have backup contraception after placement. Patient was determined  to meet WHO criteria for not being pregnant. Appropriate time out taken.  The insertion site was identified 8-10 cm (3-4 inches) from the medial epicondyle of the humerus and 3-5 cm (1.25-2 inches) posterior to (below) the sulcus (groove) between the biceps and triceps muscles of the patient's *** arm and marked.  Patient was prepped with alcohol swab and then injected with 3 ml of 1% lidocaine.  Arm was prepped with chlorhexidene, Nexplanon removed from packaging,  Device confirmed in needle, then inserted full length of needle and  withdrawn per handbook instructions. Nexplanon was able to palpated in the patient's arm; patient palpated the insert herself. There was minimal blood loss.  Patient insertion site covered with guaze and a pressure bandage to reduce any bruising.  The patient tolerated the procedure well and was given post procedure instructions.   Nexplanon:   Counseled patient to take OTC analgesic starting as soon as lidocaine starts to wear off and take regularly for at least 48 hr to decrease discomfort.  Specifically to take with food or milk to decrease stomach upset and for IB 600 mg (3 tablets) every 6 hrs; IB 800 mg (4 tablets) every 8 hrs; or Aleve 2 tablets every 12 hrs.  Patient was not offered ECP, as it was not indicated today. Marland Kitchen ECP {Actions; was/was not:31712} accepted by the patient. ECP counseling {WAS/WAS NOT:[phone]::"was not"} Recommended delay in pregnancy for 18 months   2. Infant feeding:  patient is currently feeding with breastmilk and Enfamil formula by bottle.   -Recommended patient engage with WIC/BFpeer counselors  -Counseled to sign new child up for [facility] services 3. Mood: EPDS is low risk. Reviewed resources and that mood sx in first year after pregnancy are considered related to pregnancy and to reach out for help at [facility] if needed. Discussed [facility] as link to care and availability of LCSW for counseling  4. Chronic Medical Conditions:  *** list chronic medical problems and follow up/management plan.   1. Postpartum exam WNL, see plan above. HIV/Syphilis testing and hgb today.  2. 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 LFTs drawn today.  3. 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   Patient given handout about PCP care in the community Given  MVI per family planning program guidelines and availability  Follow up in: 12 months or as needed.

## 2019-06-26 NOTE — Progress Notes (Signed)
Here today for PP appt. Had [facility] IOL 05/15/2019.  NSVD 05/16/2019. Plans Nexplanon for PP BCM. Hal Morales, RN

## 2019-06-26 NOTE — Progress Notes (Signed)
Pap NIL, next in 26yr. RN to send pap card

## 2019-06-27 LAB — HEPATIC FUNCTION PANEL
ALT: 38 IU/L — ABNORMAL HIGH (ref 0–32)
AST: 21 IU/L (ref 0–40)
Albumin: 4.4 g/dL (ref 3.9–5.0)
Alkaline Phosphatase: 94 IU/L (ref 39–117)
Bilirubin Total: 0.3 mg/dL (ref 0.0–1.2)
Bilirubin, Direct: 0.07 mg/dL (ref 0.00–0.40)
Total Protein: 7 g/dL (ref 6.0–8.5)

## 2019-06-30 NOTE — Progress Notes (Signed)
Pap card mailed. Next pap in 3 years per Rush Landmark, RN

## 2019-07-15 ENCOUNTER — Telehealth: Payer: Self-pay

## 2019-07-15 ENCOUNTER — Other Ambulatory Visit: Payer: Self-pay

## 2019-07-15 DIAGNOSIS — R7612 Nonspecific reaction to cell mediated immunity measurement of gamma interferon antigen response without active tuberculosis: Secondary | ICD-10-CM

## 2019-07-15 MED ORDER — RIFAMPIN 300 MG PO CAPS: 600.0000 mg | ORAL_CAPSULE | Freq: Every day | ORAL | 4 refills | Status: DC

## 2019-07-15 NOTE — Telephone Encounter (Signed)
TC with patient re: +QFT and restarting Rifampin.  Patient would like to proceed with Rifampin appt. Scheduled for 07/17/2019. EPI from 02/25/2019 updated. Richmond Campbell, RN

## 2019-07-15 NOTE — Progress Notes (Signed)
Tuberculosis treatment orders  All patients are to be monitored per [place] and county TB policies.    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 .  EPI updated 07/15/2019, CXR normal 02/26/2019, +QFT 02/04/2019  Breast feeding; PP visit 06/26/2019

## 2019-07-16 NOTE — Progress Notes (Signed)
EPI updated via phone before scheduling Rifampin restart on 07/15/2019.  159lb 64"; no period since delivery.  Had PP on 06/26/2019; breastfeeding. No changes in medical hx, alleriges.  Nexplanon for birth control. Last HIV 06/26/2019- negative Richmond Campbell, RN

## 2019-07-16 NOTE — Progress Notes (Signed)
Event organiser for TB program: Reviewed orders by TB RN. Agree with Rifampin treatment  Federico Flake, MD, MPH, ABFM [facility] Medical Director

## 2019-07-17 ENCOUNTER — Ambulatory Visit (LOCAL_COMMUNITY_HEALTH_CENTER): Payer: Self-pay

## 2019-07-17 ENCOUNTER — Other Ambulatory Visit: Payer: Self-pay

## 2019-07-17 DIAGNOSIS — R7612 Nonspecific reaction to cell mediated immunity measurement of gamma interferon antigen response without active tuberculosis: Secondary | ICD-10-CM

## 2019-07-17 MED ORDER — RIFAMPIN 300 MG PO CAPS: 600.0000 mg | ORAL_CAPSULE | Freq: Every day | ORAL | 0 refills | Status: DC

## 2019-07-17 NOTE — Progress Notes (Signed)
Client initiated TLTBI 03/14/2019 and self discontinued after one week due to feeling nauseated and dizzy. As pregnant at the time, client decided to re-initiate TLTBI post-partum. Delivered 05/16/2019 and pp exam at [facility] on 06/26/2019 (baseline labs obtained at that time and available in computer in addition to 02/26/2019 CXR). Currently breastfeeding. Client able to verbalize reason for taking Rifampin. Counseled on how to take the medicine, potential side effects and measures to take should they occur. Given following handouts: Active versus Latent TB Infection, Important Information About Rifampin. Rifampin initiated per standing orders. Jossie Ng, RN

## 2019-08-11 ENCOUNTER — Telehealth: Payer: Self-pay

## 2019-08-11 NOTE — Telephone Encounter (Signed)
TC with patient re: Rifampin appt.  Scheduled for TBM on 08/15/19 Richmond Campbell, RN   Interpreter V. Olmedo

## 2019-08-15 ENCOUNTER — Ambulatory Visit (LOCAL_COMMUNITY_HEALTH_CENTER): Payer: Self-pay

## 2019-08-15 ENCOUNTER — Other Ambulatory Visit: Payer: Self-pay

## 2019-08-15 VITALS — Wt 162.0 lb

## 2019-08-15 DIAGNOSIS — R7612 Nonspecific reaction to cell mediated immunity measurement of gamma interferon antigen response without active tuberculosis: Secondary | ICD-10-CM

## 2019-08-15 MED ORDER — RIFAMPIN 300 MG PO CAPS: 600.0000 mg | ORAL_CAPSULE | Freq: Every day | ORAL | 0 refills | Status: AC

## 2019-08-16 LAB — HEPATIC FUNCTION PANEL
ALT: 20 IU/L (ref 0–32)
AST: 20 IU/L (ref 0–40)
Albumin: 4.1 g/dL (ref 3.9–5.0)
Alkaline Phosphatase: 97 IU/L (ref 39–117)
Bilirubin Total: <0.2 mg/dL (ref 0.0–1.2)
Bilirubin, Direct: 0.07 mg/dL (ref 0.00–0.40)
Total Protein: 6.6 g/dL (ref 6.0–8.5)

## 2019-08-29 ENCOUNTER — Telehealth: Payer: Self-pay

## 2019-08-29 NOTE — Progress Notes (Signed)
Rifampin 300mg  #60 dispensed to patient per [facility] order [facility], RN

## 2019-08-29 NOTE — Telephone Encounter (Signed)
TC from patient.  Scheduled LTBI tx appt for 09/10/19 Richmond Campbell, RN   Interpreter V. Olmedo

## 2019-09-23 ENCOUNTER — Telehealth: Payer: Self-pay

## 2019-09-23 NOTE — Telephone Encounter (Signed)
TC with patient.  Scheduled #3 Rifampin appt. Richmond Campbell, RN

## 2019-09-25 ENCOUNTER — Other Ambulatory Visit: Payer: Self-pay

## 2019-09-25 ENCOUNTER — Ambulatory Visit (LOCAL_COMMUNITY_HEALTH_CENTER): Payer: Self-pay

## 2019-09-25 VITALS — Wt 161.0 lb

## 2019-09-25 DIAGNOSIS — R7612 Nonspecific reaction to cell mediated immunity measurement of gamma interferon antigen response without active tuberculosis: Secondary | ICD-10-CM

## 2019-09-25 MED ORDER — RIFAMPIN 300 MG PO CAPS: 600.0000 mg | ORAL_CAPSULE | Freq: Every day | ORAL | 0 refills | Status: AC

## 2019-09-26 LAB — HEPATIC FUNCTION PANEL
ALT: 16 IU/L (ref 0–32)
AST: 18 IU/L (ref 0–40)
Albumin: 4.3 g/dL (ref 3.9–5.0)
Alkaline Phosphatase: 104 IU/L (ref 39–117)
Bilirubin Total: <0.2 mg/dL (ref 0.0–1.2)
Bilirubin, Direct: 0.07 mg/dL (ref 0.00–0.40)
Total Protein: 6.7 g/dL (ref 6.0–8.5)

## 2019-11-06 ENCOUNTER — Ambulatory Visit (LOCAL_COMMUNITY_HEALTH_CENTER): Payer: Self-pay

## 2019-11-06 ENCOUNTER — Other Ambulatory Visit: Payer: Self-pay

## 2019-11-06 VITALS — Wt 155.0 lb

## 2019-11-06 DIAGNOSIS — R7612 Nonspecific reaction to cell mediated immunity measurement of gamma interferon antigen response without active tuberculosis: Secondary | ICD-10-CM

## 2019-11-06 MED ORDER — RIFAMPIN 300 MG PO CAPS: 600.0000 mg | ORAL_CAPSULE | Freq: Every day | ORAL | 0 refills | Status: AC

## 2019-11-07 LAB — HEPATIC FUNCTION PANEL
ALT: 15 IU/L (ref 0–32)
AST: 12 IU/L (ref 0–40)
Albumin: 4.4 g/dL (ref 3.9–5.0)
Alkaline Phosphatase: 109 IU/L (ref 39–117)
Bilirubin Total: <0.2 mg/dL (ref 0.0–1.2)
Bilirubin, Direct: 0.06 mg/dL (ref 0.00–0.40)
Total Protein: 6.6 g/dL (ref 6.0–8.5)

## 2019-11-10 NOTE — Progress Notes (Signed)
Rifampin 300mg  #30 dispensed per [facility] order. TB RN will mail completion paperwork to patient. [facility], RN

## 2020-02-29 ENCOUNTER — Other Ambulatory Visit: Payer: Self-pay

## 2020-02-29 DIAGNOSIS — Z79899 Other long term (current) drug therapy: Secondary | ICD-10-CM | POA: Insufficient documentation

## 2020-02-29 DIAGNOSIS — R197 Diarrhea, unspecified: Secondary | ICD-10-CM | POA: Insufficient documentation

## 2020-02-29 DIAGNOSIS — Z87891 Personal history of nicotine dependence: Secondary | ICD-10-CM | POA: Insufficient documentation

## 2020-02-29 DIAGNOSIS — R519 Headache, unspecified: Secondary | ICD-10-CM | POA: Insufficient documentation

## 2020-02-29 DIAGNOSIS — Z20822 Contact with and (suspected) exposure to covid-19: Secondary | ICD-10-CM | POA: Insufficient documentation

## 2020-02-29 LAB — COMPREHENSIVE METABOLIC PANEL
ALT: 16 U/L (ref 0–44)
AST: 19 U/L (ref 15–41)
Albumin: 5.2 g/dL — ABNORMAL HIGH (ref 3.5–5.0)
Alkaline Phosphatase: 83 U/L (ref 38–126)
Anion gap: 15 (ref 5–15)
BUN: 19 mg/dL (ref 6–20)
CO2: 18 mmol/L — ABNORMAL LOW (ref 22–32)
Calcium: 9.5 mg/dL (ref 8.9–10.3)
Chloride: 108 mmol/L (ref 98–111)
Creatinine, Ser: 1.12 mg/dL — ABNORMAL HIGH (ref 0.44–1.00)
GFR calc Af Amer: >60 mL/min (ref >60)
GFR calc non Af Amer: >60 mL/min (ref >60)
Glucose, Bld: 98 mg/dL (ref 70–99)
Potassium: 3.8 mmol/L (ref 3.5–5.1)
Sodium: 141 mmol/L (ref 135–145)
Total Bilirubin: 1 mg/dL (ref 0.3–1.2)
Total Protein: 8.5 g/dL — ABNORMAL HIGH (ref 6.5–8.1)

## 2020-02-29 LAB — CBC
HCT: 44 % (ref 36.0–46.0)
Hemoglobin: 15.7 g/dL — ABNORMAL HIGH (ref 12.0–15.0)
MCH: 30.1 pg (ref 26.0–34.0)
MCHC: 35.7 g/dL (ref 30.0–36.0)
MCV: 84.5 fL (ref 80.0–100.0)
Platelets: 325 10*3/uL (ref 150–400)
RBC: 5.21 MIL/uL — ABNORMAL HIGH (ref 3.87–5.11)
RDW: 12.1 % (ref 11.5–15.5)
WBC: 17.5 10*3/uL — ABNORMAL HIGH (ref 4.0–10.5)
nRBC: 0 % (ref 0.0–0.2)

## 2020-02-29 LAB — LIPASE, BLOOD: Lipase: 41 U/L (ref 11–51)

## 2020-02-29 NOTE — ED Triage Notes (Signed)
Pt arrives to ED by EMS with complaints of nausea, vomiting, and headache x 1 day.

## 2020-03-01 ENCOUNTER — Emergency Department
Admission: EM | Admit: 2020-03-01 | Discharge: 2020-03-01 | Disposition: A | Discharge status: Home / Self Care | Payer: Self-pay | Attending: Emergency Medicine | Admitting: Emergency Medicine

## 2020-03-01 DIAGNOSIS — R197 Diarrhea, unspecified: Secondary | ICD-10-CM

## 2020-03-01 LAB — SARS CORONAVIRUS 2 BY RT PCR (HOSPITAL ORDER, PERFORMED IN ~~LOC~~ HOSPITAL LAB): SARS Coronavirus 2: NEGATIVE

## 2020-03-01 MED ORDER — IBUPROFEN 400 MG PO TABS
400.0000 mg | ORAL_TABLET | Freq: Once | ORAL | Status: AC
  Administered 2020-03-01: 400 mg via ORAL
  Filled 2020-03-01: qty 1

## 2020-03-01 MED ORDER — ONDANSETRON 4 MG PO TBDP: 4.0000 mg | ORAL_TABLET | Freq: Three times a day (TID) | ORAL | 0 refills | Status: DC | PRN

## 2020-03-01 NOTE — ED Notes (Signed)
Tolerating PO fluids °

## 2020-03-01 NOTE — ED Provider Notes (Signed)
[facility] Emergency Department Provider Note  ____________________________________________  Time seen: Approximately 5:22 AM  I have reviewed the triage vital signs and the nursing notes.   HISTORY  Chief Complaint Emesis, Diarrhea, and Headache   HPI Crystal Wright is a 24 y.o. female who presents for evaluation of nausea, vomiting, diarrhea.  Patient is here with her mother and sister will have the same symptoms.  The symptoms started a few hours after they ate meat soup.  She reports 7 or 8 episodes of yellow emesis and 7 or 8 episodes of watery green diarrhea.  She reports that she also has a mild headache but denies fever chills, cough or congestion, chest pain or shortness of breath.  Patient denies any known exposures to Covid.  She is not vaccinated.  She is also complaining of mild diffuse crampy abdominal pain which has gotten significantly improved but is still present.  She reports receiving IV Zofran and fluids per EMS which helped her feel much better.   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 Authorizing Provider  ondansetron (ZOFRAN ODT) 4 MG disintegrating tablet Take 1 tablet (4 mg total) by mouth every 8 (eight) hours as needed. 03/01/20   Nita Sickle, MD  Prenatal Vit-Fe Fumarate-FA (MULTIVITAMIN-PRENATAL) 27-0.8 MG TABS tablet Take 1 tablet by mouth daily at 12 noon.    [provider]  rifampin (RIFADIN) 300  MG capsule Take 2 capsules (600 mg total) by mouth daily. 07/17/19   Federico Flake, MD    Allergies Patient has no known allergies.  Family History  Problem Relation Age of Onset   Seizures Mother    Migraines Paternal Grandmother    Lung disease Maternal Great-grandmother     Social History Social History   Tobacco Use   Smoking status: Former Smoker    Years: 1.00    Types: Cigarettes    Quit date: 02/04/2015    Years since quitting: 5.0   Smokeless tobacco: Never Used   Tobacco comment: smoked one cigarette per week  Vaping Use   Vaping Use: Never used  Substance Use Topics   Alcohol use: Not Currently    Comment: Last ETOH use 08/2018   Drug use: Never    Review of Systems  Constitutional: Negative for fever. Eyes: Negative for visual changes. ENT: Negative for sore throat. Neck: No neck pain  Cardiovascular: Negative for chest pain. Respiratory: Negative for shortness of breath. Gastrointestinal: + abdominal pain, vomiting and diarrhea. Genitourinary: Negative for dysuria. Musculoskeletal: Negative for back pain. Skin: Negative for rash. Neurological: Negative for  weakness or numbness. + HA Psych: No SI or HI  ____________________________________________   PHYSICAL EXAM:  VITAL SIGNS: Vitals:   03/01/20 0530 03/01/20 0600  BP: 98/70 (!) 109/91  Pulse: 72 (!) 57  Resp:    Temp:    SpO2: 100% 97%   Constitutional: Alert and oriented. Well appearing and in no apparent distress. HEENT:  Head: Normocephalic and atraumatic.         Eyes: Conjunctivae are normal. Sclera is non-icteric.       Mouth/Throat: Mucous membranes are moist.       Neck: Supple with no signs of meningismus. Cardiovascular: Regular rate and rhythm. No murmurs, gallops, or rubs. Respiratory: Normal respiratory effort. Lungs are clear to auscultation bilaterally.  Gastrointestinal: Soft, non tender, and non distended with positive bowel sounds. No rebound or  guarding. Genitourinary: No CVA tenderness. Musculoskeletal:  No edema, cyanosis, or erythema of extremities. Neurologic: Normal speech and language. Face is symmetric. Moving all extremities. No gross focal neurologic deficits are appreciated. Skin: Skin is warm, dry and intact. No rash noted. Psychiatric: Mood and affect are normal. Speech and behavior are normal.  ____________________________________________   LABS (all labs ordered are listed, but only abnormal results are displayed)  Labs Reviewed  COMPREHENSIVE METABOLIC PANEL - Abnormal; Notable for the following components:      Result Value   CO2 18 (*)    Creatinine, Ser 1.12 (*)    Total Protein 8.5 (*)    Albumin 5.2 (*)    All other components within normal limits  CBC - Abnormal; Notable for the following components:   WBC 17.5 (*)    RBC 5.21 (*)    Hemoglobin 15.7 (*)    All other components within normal limits  SARS CORONAVIRUS 2 BY RT PCR (HOSPITAL ORDER, PERFORMED IN [facility])  LIPASE, BLOOD  URINALYSIS, COMPLETE (UACMP) WITH MICROSCOPIC  POC URINE PREG, ED   ____________________________________________  EKG  none  ____________________________________________  RADIOLOGY  none  ____________________________________________   PROCEDURES  Procedure(s) performed: None Procedures Critical Care performed:  None ____________________________________________   INITIAL IMPRESSION / ASSESSMENT AND PLAN / ED COURSE   24 y.o. female who presents for evaluation of nausea, vomiting, diarrhea, crampy abdominal pain, headache.  Patient is here with her sister and mother who all have similar symptoms after eating meat soup.  Patient is well-appearing in no distress, afebrile, normal vital signs, abdomen is soft with no tenderness throughout, no meningeal signs.  Labs showing leukocytosis most likely stress response from food poisoning versus viral gastroenteritis.  Since patient has not been  vaccinated for Covid I will swab her.  I offered to give her another liter of fluids but at this time she declined as she does not want an IV placed.  Will give p.o. ibuprofen for mild headache and abdominal cramping.  Will p.o. challenge.  Anticipate discharge home with Zofran, ibuprofen, and follow-up with PCP.  Old medical records reviewed.  _________________________ 6:59 AM on 03/01/2020 -----------------------------------------  Covid pending.  Patient is tolerating p.o.  Anticipate discharge after results are back.  Discussed my standard return precautions and follow-up with PCP.    _____________________________________________ Please note:  Patient was evaluated in Emergency Department today for the symptoms described in the history of present illness. Patient was evaluated in the context of the global COVID-19 pandemic, which necessitated consideration that the patient might be at risk for infection with the SARS-CoV-2 virus that causes COVID-19. Institutional protocols and algorithms that pertain to the evaluation of patients at risk for COVID-19 are in a state of rapid change based on information released by regulatory bodies including the CDC and federal and state organizations. These policies and algorithms were followed during the patient's care in the ED.  Some ED evaluations and interventions may be delayed as a result of limited staffing during the pandemic.  [place] Controlled Substance Database was reviewed by me. ____________________________________________   FINAL CLINICAL IMPRESSION(S) / ED DIAGNOSES   Final diagnoses:  Nausea vomiting and diarrhea      NEW MEDICATIONS STARTED DURING THIS VISIT:  ED Discharge Orders         Ordered    ondansetron (ZOFRAN ODT) 4 MG disintegrating tablet  Every 8 hours PRN        03/01/20 0616           Note:  This document was prepared using Dragon voice recognition software and may include unintentional dictation errors.      Don Perking, [place], MD 03/01/20 [phone]

## 2021-05-13 ENCOUNTER — Other Ambulatory Visit: Payer: Self-pay

## 2021-05-13 ENCOUNTER — Ambulatory Visit (LOCAL_COMMUNITY_HEALTH_CENTER): Payer: Self-pay | Admitting: Physician Assistant

## 2021-05-13 VITALS — BP 100/63 | Ht 64.0 in | Wt 164.0 lb

## 2021-05-13 DIAGNOSIS — Z3046 Encounter for surveillance of implantable subdermal contraceptive: Secondary | ICD-10-CM

## 2021-05-13 DIAGNOSIS — Z113 Encounter for screening for infections with a predominantly sexual mode of transmission: Secondary | ICD-10-CM

## 2021-05-13 DIAGNOSIS — Z0000 Encounter for general adult medical examination without abnormal findings: Secondary | ICD-10-CM

## 2021-05-13 DIAGNOSIS — Z3009 Encounter for other general counseling and advice on contraception: Secondary | ICD-10-CM

## 2021-05-13 LAB — HM HEPATITIS C SCREENING LAB: HM Hepatitis Screen: NEGATIVE

## 2021-05-13 LAB — HM HIV SCREENING LAB: HM HIV Screening: NEGATIVE

## 2021-05-16 ENCOUNTER — Encounter: Payer: Self-pay | Admitting: Physician Assistant

## 2021-05-16 NOTE — Progress Notes (Signed)
Family Planning Visit- Repeat Yearly Visit  Subjective:  Crystal Wright is a 25 y.o. G2P1001  being seen today for an annual wellness visit and to discuss contraception options.   The patient is currently using Nexplanon for pregnancy prevention. Patient does not want a pregnancy in the next year. 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  Chief Complaint  Patient presents with   Contraception    Annual exam    Patient reports that she is doing well with the Nexplanon and does not have a period with this method.  Denies changes to her family history since her last exam.  Reports she had noticed a "lump" in her left breast about 1 week ago that was painful but the pain has resolved.  Denies change in size, swelling, redness or warmth.  Per chart review, pap is due in 2023.  Patient denies any other concerns.   See flowsheet for other program required questions.   Body mass index is 28.15 kg/m. - Patient is eligible for diabetes screening based on BMI and age >50?  not applicable HA1C ordered? not applicable  Patient reports 1 of partners in last year. Desires STI screening?  Yes   Has patient been screened once for HCV in the past?  No  No results found for: HCVAB  Does the patient have current of drug use, have a partner with drug use, and/or has been incarcerated since last result? No  If yes-- Screen for HCV through [facility]   Does the patient meet criteria for HBV testing? No  Criteria:  -Household, sexual or needle sharing contact with HBV -History of drug use -HIV positive -Those with known Hep C   Health Maintenance Due  Topic Date Due   HPV VACCINES (1 - 2-dose series) Never done   Hepatitis C Screening  Never done   INFLUENZA  VACCINE  02/07/2021    Review of Systems  All other systems reviewed and are negative.  The following portions of the patient's history were reviewed and updated as appropriate: allergies, current medications, past family history, past medical history, past social history, past surgical history and problem list. Problem list updated.  Objective:   Vitals:   05/13/21 1316  BP: 100/63  Weight: 164 lb (74.4 kg)  Height: 5\' 4"  (1.626 m)    Physical Exam Vitals reviewed.  Constitutional:      General: She is not in acute distress.    Appearance: Normal appearance.  HENT:     Head: Normocephalic and atraumatic.     Mouth/Throat:     Mouth: Mucous membranes are moist.     Pharynx: Oropharynx is clear. No oropharyngeal exudate or posterior oropharyngeal erythema.  Eyes:     Conjunctiva/sclera: Conjunctivae normal.  Neck:     Thyroid: No thyroid mass, thyromegaly or thyroid tenderness.  Cardiovascular:     Rate and Rhythm: Normal rate and regular rhythm.  Pulmonary:     Effort: Pulmonary effort is normal.     Breath sounds: Normal breath sounds.  Chest:  Breasts:    Right: Normal. No mass, nipple discharge, skin change or tenderness.     Left: Mass present. No nipple discharge, skin change or tenderness.    Abdominal:     Palpations: Abdomen is soft. There is no mass.  Tenderness: There is no abdominal tenderness. There is no guarding or rebound.  Musculoskeletal:     Cervical back: Neck supple. No tenderness.  Lymphadenopathy:     Cervical: No cervical adenopathy.     Upper Body:     Right upper body: No supraclavicular, axillary or pectoral adenopathy.     Left upper body: No supraclavicular, axillary or pectoral adenopathy.  Skin:    General: Skin is warm and dry.     Findings: No bruising, erythema, lesion or rash.  Neurological:     Mental Status: She is alert and oriented to person, place, and time.  Psychiatric:        Mood and Affect: Mood normal.         Behavior: Behavior normal.        Thought Content: Thought content normal.        Judgment: Judgment normal.      Assessment and Plan:  Crystal Wright is a 25 y.o. female G2P1001 presenting to the [facility] Department for an yearly wellness and contraception visit  Contraception counseling: Reviewed all forms of birth control options in the tiered based approach. available including abstinence; over the counter/barrier methods; hormonal contraceptive medication including pill, patch, ring, injection,contraceptive implant, ECP; hormonal and nonhormonal IUDs; permanent sterilization options including vasectomy and the various tubal sterilization modalities. Risks, benefits, and typical effectiveness rates were reviewed.  Questions were answered.  Written information was also given to the patient to review.  Patient desires to continue with current Nexplanon, this was prescribed for patient. She will follow up in  1 month for repeat CBE or sooner if she notices changes to area, then 1 year and prn for surveillance.  She was told to call with any further questions, or with any concerns about this method of contraception.  Emphasized use of condoms 100% of the time for STI prevention.  Patient was not a candidate for ECP today.    1. Encounter for counseling regarding contraception Reviewed with patient as above re: BCM options. Reviewed with patient normal SE of Nexplanon and when to call clinic with concerns. Enc condoms with all sex for STD protection.   2. Screening for STD (sexually transmitted disease) Await test results.  Counseled patient that RN will call if needs to RTC for treatment once results are back.   - HIV/HCV [facility] - Syphilis Serology, [facility]  3. Well woman exam (no gynecological exam) Reviewed with patient healthy habits to maintain general health. Enc MVI 1 po daily. Counseled patient to monitor area of concern in left breast, but not to  repeatedly check the area and RTC in 1 month for recheck/CBE. Enc to establish with/ follow up with PCP for primary care concerns, age appropriate screenings and illness.   4. Surveillance of previously prescribed implantable subdermal contraceptive Continue with Nexplanon that is currently in arm until removal is due in 06/2022 or 06/2023.     Return in about 1 year (around 05/13/2022) for RP and prn.  Future Appointments  Date Time Provider Department Center  06/10/2021  9:00 AM [facility] PROVIDER [facility] None    Matt Holmes, PA

## 2021-06-10 ENCOUNTER — Ambulatory Visit: Payer: Self-pay

## 2021-06-10 ENCOUNTER — Other Ambulatory Visit: Payer: Self-pay

## 2021-06-16 ENCOUNTER — Ambulatory Visit (LOCAL_COMMUNITY_HEALTH_CENTER): Payer: Self-pay | Admitting: Family Medicine

## 2021-06-16 ENCOUNTER — Encounter: Payer: Self-pay | Admitting: Advanced Practice Midwife

## 2021-06-16 ENCOUNTER — Other Ambulatory Visit: Payer: Self-pay

## 2021-06-16 VITALS — BP 89/59 | Ht 64.0 in | Wt 160.4 lb

## 2021-06-16 DIAGNOSIS — N6332 Unspecified lump in axillary tail of the left breast: Secondary | ICD-10-CM | POA: Insufficient documentation

## 2021-06-16 DIAGNOSIS — Z3009 Encounter for other general counseling and advice on contraception: Secondary | ICD-10-CM

## 2021-06-16 HISTORY — DX: Unspecified lump in axillary tail of the left breast: N63.32

## 2021-06-16 NOTE — Progress Notes (Signed)
Patient here for follow-up on left breast lump. PE was 05/13/2021, and last PAP 02/04/2019, NIL. Patient C/O pain from lump on left breast radiating to shoulder and down to elbow at times. She states that sometimes it does not hurt at all.Burt Knack, RN   AVS given and patient aware to expect call from breast center.Burt Knack, RN

## 2021-06-16 NOTE — Progress Notes (Signed)
S: Pt in clinic to re-evaluation of left breast lump.   Pt was seen ~1 month ago at [facility] and reports no changes.  Lump has not gotten any smaller or bigger, still painful, but denies edema, erythema or changes in skin.    O: Pt has ~65mm papule in the axillary tail of the left breast Physical Exam Constitutional:      Appearance: Normal appearance.  HENT:     Head: Normocephalic and atraumatic.  Pulmonary:     Effort: Pulmonary effort is normal.  Chest:  Breasts:    Tanner Score is 5.     Right: Normal. No swelling, bleeding, inverted nipple, mass, nipple discharge, skin change or tenderness.     Left: Mass and tenderness present. No swelling, bleeding, inverted nipple, nipple discharge or skin change.    Skin:    General: Skin is warm and dry.  Neurological:     Mental Status: She is alert and oriented to person, place, and time.  Psychiatric:        Mood and Affect: Mood normal.        Behavior: Behavior normal.     A/P: referred sent to [facility] clinic for evaluation.   Patient asked to set up voicemail so that message can be left if call is missed.    Pt verbalized understanding.   M. [place] used for Spanish interpretation.     Wendi Snipes, FNP

## 2021-06-16 NOTE — Patient Instructions (Signed)
[facility] [phone]  Please set up voicemail to get or receive messages.

## 2021-06-17 NOTE — Progress Notes (Signed)
TC to [facility] to obtain fax number for patient referral. Per Efraim Kaufmann, Joellyn Quails is the person to ask about the pink ribbon program. TC to Joellyn Quails who states there is no money in the pink ribbon fund currently, but that she will try to get the patient in for a mammogram under [facility]. Per Joellyn Quails, she does not need to have referral faxed to her, so referral faxed to [facility] with confirmation. Per Joellyn Quails, she will reach out to patient to get her scheduled. Information shared with provider, Elveria Rising, FNP.

## 2021-06-22 ENCOUNTER — Other Ambulatory Visit: Payer: Self-pay

## 2021-06-22 ENCOUNTER — Encounter: Payer: Self-pay | Admitting: *Deleted

## 2021-06-22 ENCOUNTER — Ambulatory Visit: Payer: Self-pay | Attending: Oncology | Admitting: *Deleted

## 2021-06-22 VITALS — BP 106/61 | HR 72 | Temp 96.7°F | Ht 65.0 in | Wt 158.2 lb

## 2021-06-22 DIAGNOSIS — N6321 Unspecified lump in the left breast, upper outer quadrant: Secondary | ICD-10-CM

## 2021-06-22 NOTE — Progress Notes (Signed)
°  Subjective:     Patient ID: Crystal Wright, female   DOB: 08/24/1995, 25 y.o.   MRN: [id number]  HPI  [facility] Medical History Record - 06/22/21 8144       Breast History   Screening cycle New    Provider (CBE) [facility]    Last Mammogram Never    Recent Breast Symptoms Lump;Pain   left breast radiating up arm     Breast Cancer History   Breast Cancer History No personal or family history      Previous History of Breast Problems   Breast Surgery or Biopsy None    Breast Implants N/A    BSE Done Never      Gynecological/Obstetrical History   LMP 06/15/21    Is there any chance that the client could be pregnant?  No    Age at menarche 25    Age at menopause n/a    PAP smear history Annually   < 3 years   Age at first live birth 25    Breast fed children Yes (type length in comments)   1 year 8 months   DES Exposure No    Cervical, Uterine or Ovarian cancer No    Family history of Cervial, Uterine or Ovarian cancer No    Hysterectomy No    Cervix removed No    Ovaries removed No    Laser/Cryosurgery No    Current method of birth control --   Nexplanon   Current method of Estrogen/Hormone replacement None    Smoking history None               Review of Systems     Objective:   Physical Exam Chest:  Breasts:    Right: No swelling, bleeding, inverted nipple, mass, nipple discharge, skin change or tenderness.     Left: No swelling, bleeding, inverted nipple, mass, nipple discharge, skin change or tenderness.    Lymphadenopathy:     Upper Body:     Right upper body: No supraclavicular or axillary adenopathy.     Left upper body: No supraclavicular or axillary adenopathy.      Assessment:     25 year old Hispanic female referred to [facility] from [facility] for further evaluation of a left breast mass.  ANM Interpreter, Lily, present during the interview and exam.  Patient states she had a left breast mass that waxes and wanes over the  last 2 months.  States she has intermittent pain that radiates from the lump to her left shoulder and arm.  States she feels "needle" like sensation at times.  Rates pain a 7 on a 0/10 scale.  Clinical breast exam reveals an approximate 1 mm nodule around 1:00 7 cm from the nipple.  I can actually palpate similar tissue on the left, but patient is adamant it feels different to her.  She is also adamant that the pain comes from the tiny nodule.  Denies any trauma or exercise related injury.  Taught self breast awareness.  Patient has been screened for eligibility.  She does not have any insurance, Medicare or Medicaid.  She also meets financial eligibility.      Plan:     Will go ahead and get a left breast ultrasound only due to patient's age.  If no findings on imaging I have encouraged the patient to return to the health department for possible muscular pain.  She is agreeable to the plan.

## 2021-06-24 ENCOUNTER — Other Ambulatory Visit: Payer: Self-pay

## 2021-06-24 ENCOUNTER — Ambulatory Visit
Admission: RE | Admit: 2021-06-24 | Discharge: 2021-06-24 | Disposition: A | Discharge status: Home / Self Care | Payer: Self-pay | Source: Ambulatory Visit | Attending: Oncology | Admitting: Oncology

## 2021-06-24 DIAGNOSIS — N6321 Unspecified lump in the left breast, upper outer quadrant: Secondary | ICD-10-CM | POA: Insufficient documentation

## 2021-06-27 ENCOUNTER — Encounter: Payer: Self-pay | Admitting: *Deleted

## 2021-06-27 ENCOUNTER — Other Ambulatory Visit: Payer: Self-pay | Admitting: *Deleted

## 2021-06-27 DIAGNOSIS — N6321 Unspecified lump in the left breast, upper outer quadrant: Secondary | ICD-10-CM

## 2021-06-27 NOTE — Progress Notes (Signed)
Letter mailed to inform patient of her next ultrsound on 12/28/21 @ 4:00.

## 2021-12-28 ENCOUNTER — Other Ambulatory Visit: Payer: Self-pay

## 2022-02-24 IMAGING — US US BREAST*L* LIMITED INC AXILLA
1 series · 6 of 6 positions shown · non-contrast
Comparison: None.

CLINICAL DATA: 25-year-old female presenting for evaluation of a
palpable lump in the upper-outer left breast. She states that this
fluctuates in size and tenderness over time. The pain sometimes
starts at this location and extends into her shoulder.

EXAM:
ULTRASOUND OF THE LEFT BREAST

[Series 1: us breast*left* limited inc axilla · 0.03mm/px · 6 of 6 slices shown]
[im 1/6]
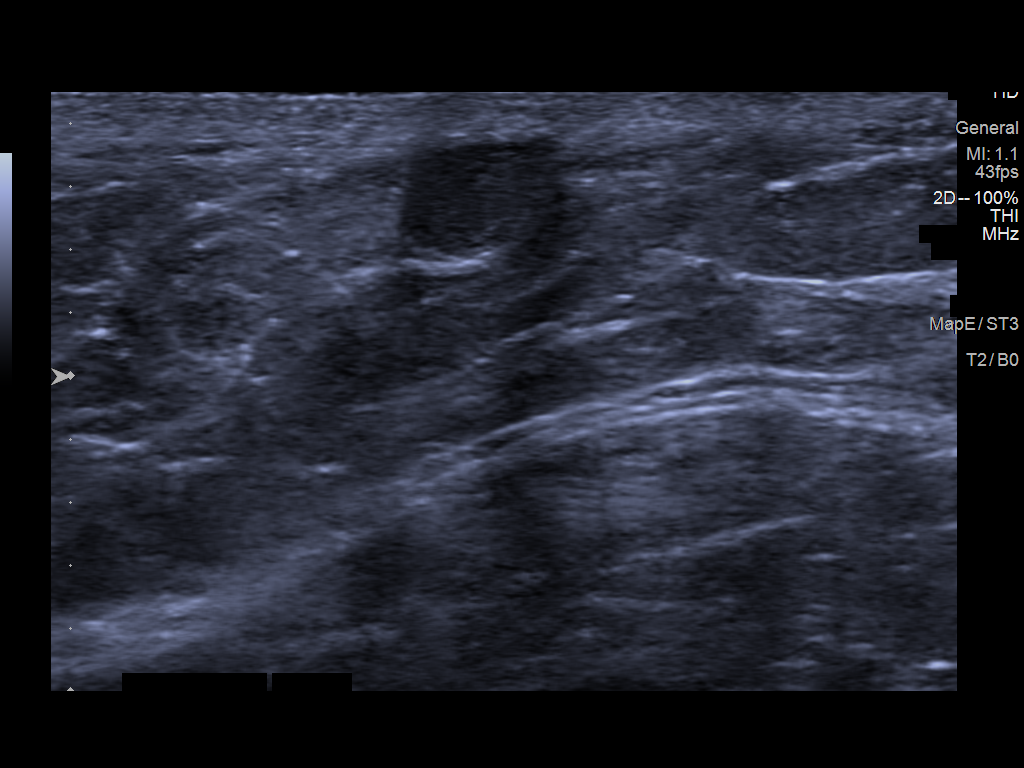
[im 2/6]
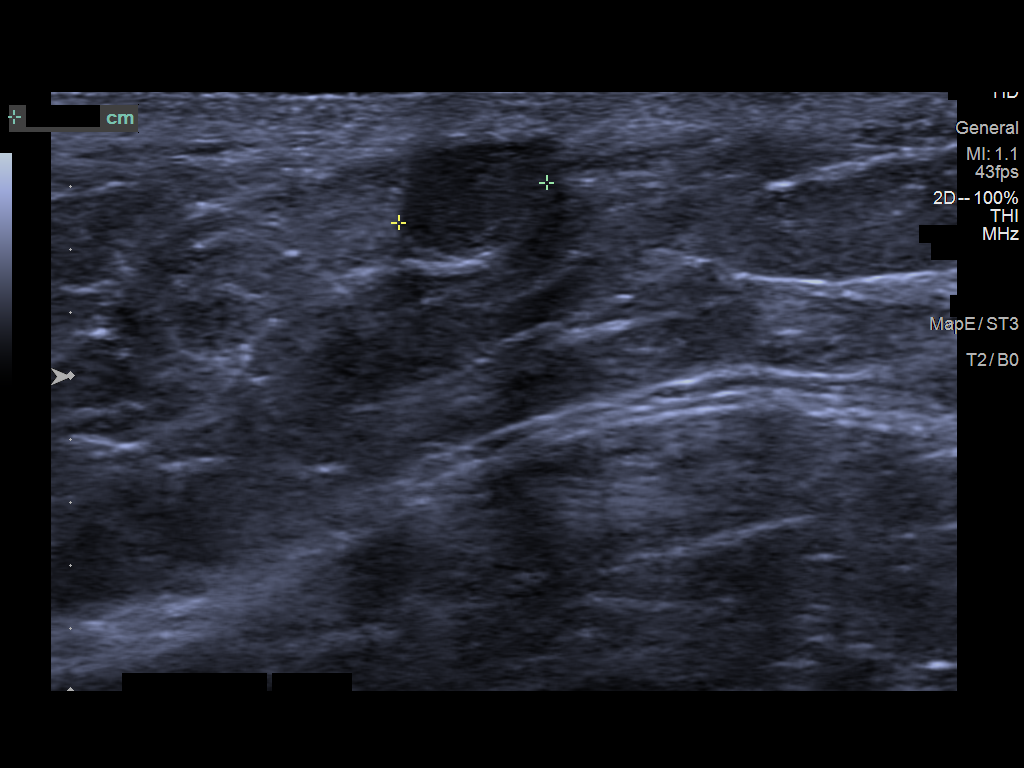
[im 3/6]
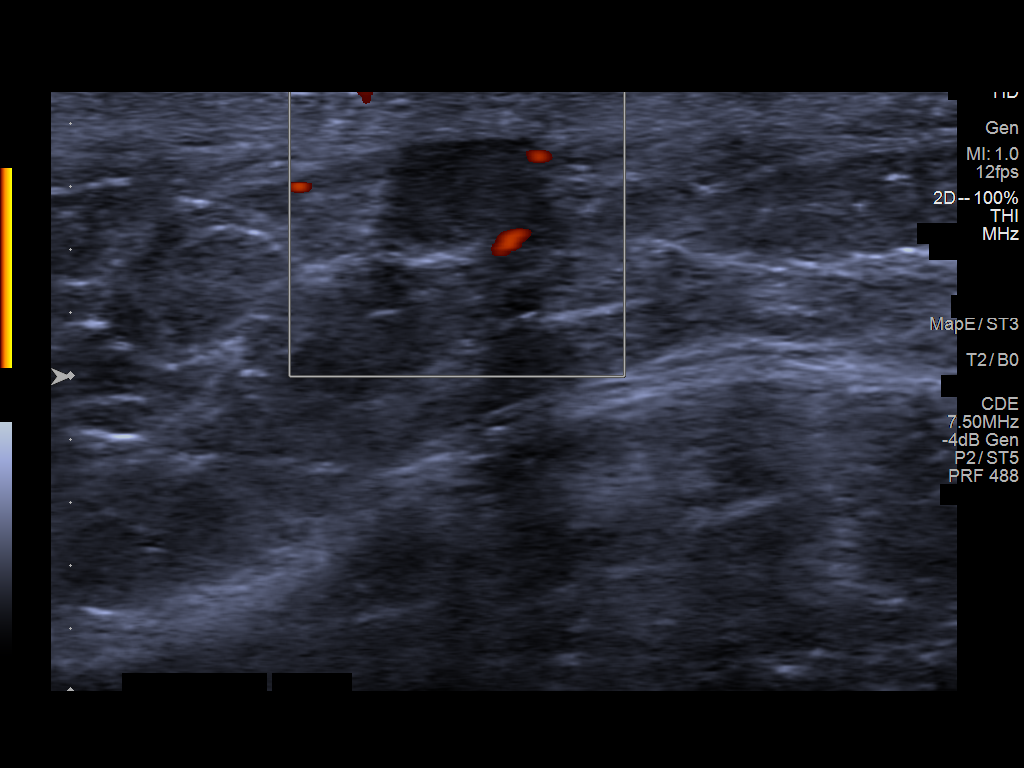
[im 4/6]
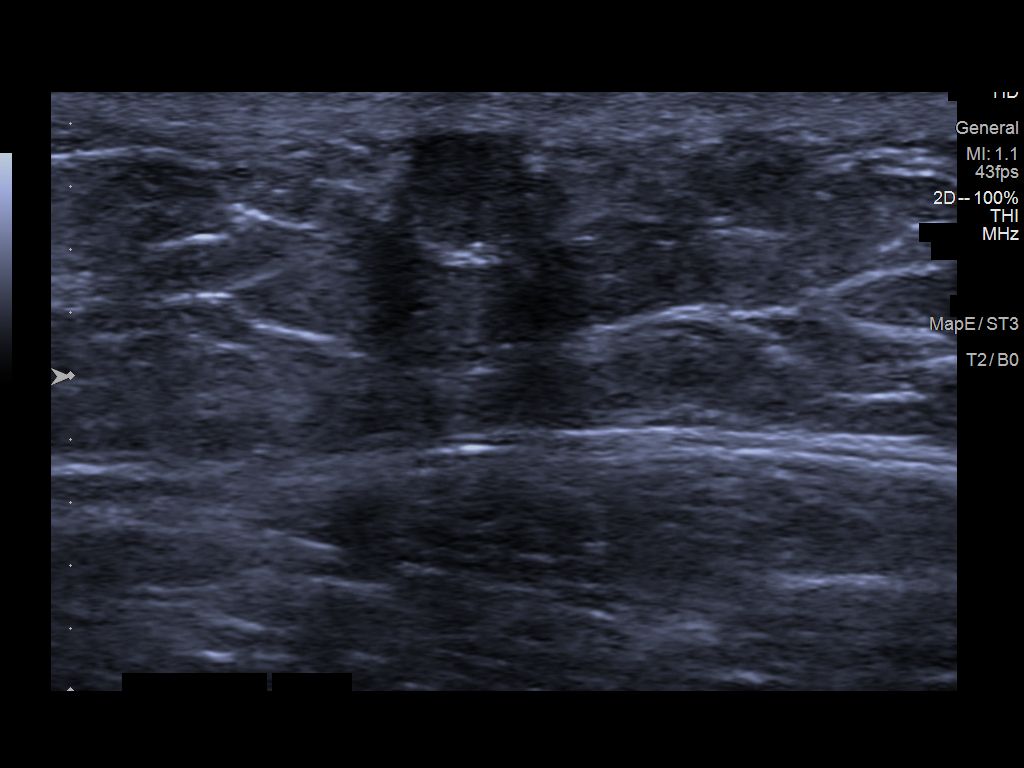
[im 5/6]
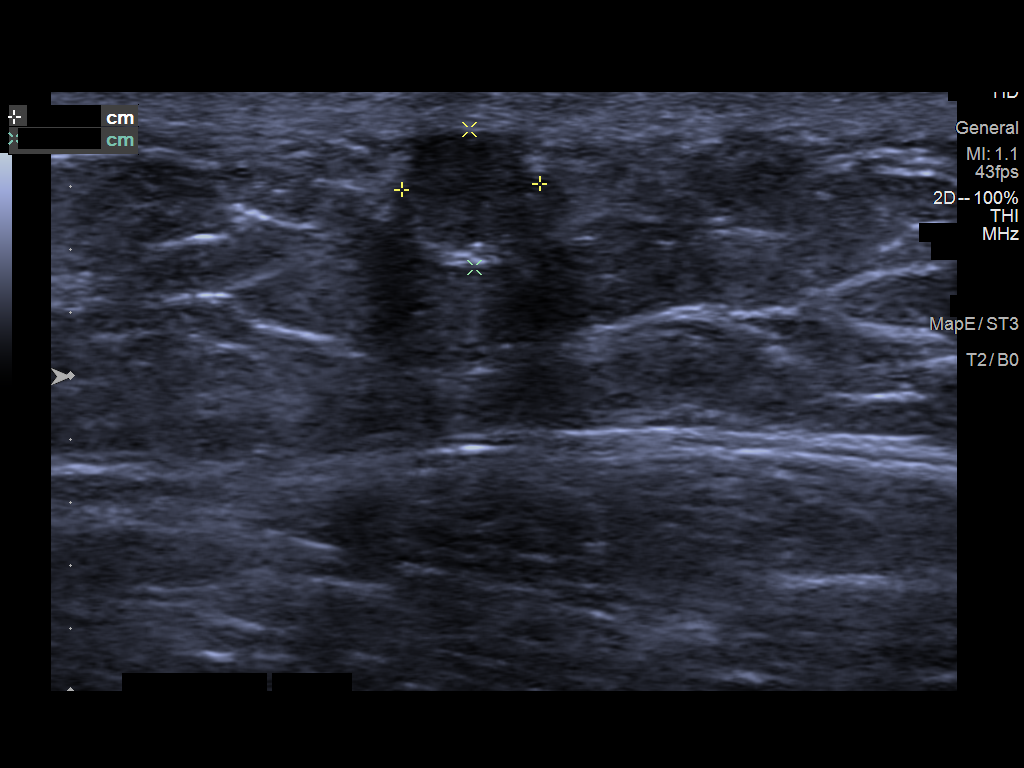
[im 6/6]
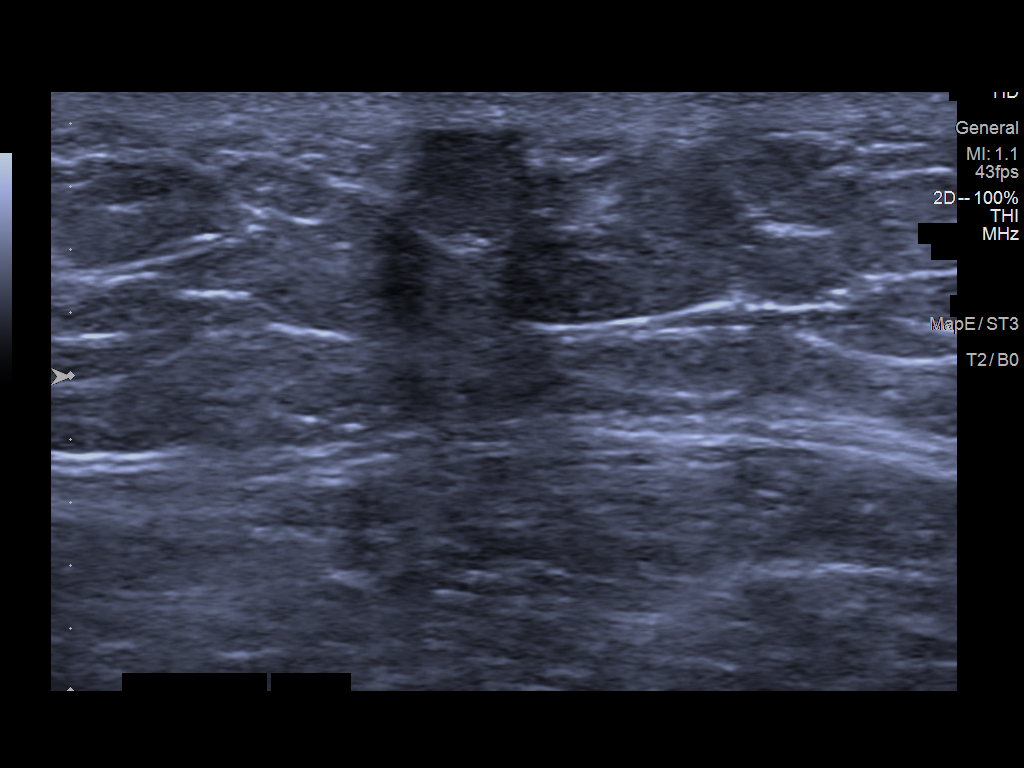

[6 of 6 positions shown; findings below may reference images not displayed]

FINDINGS: On physical exam, there is a very subtle superficial palpable lump
at the site of concern in the upper-outer left breast.

Ultrasound targeted to this site demonstrates a superficial oval
hypoechoic mass measuring 5 x 4 x 4 mm.
IMPRESSION: There is a likely benign mass in the left breast at [DATE]. This is
favored to represent a fibroadenoma.

RECOMMENDATION:
Six-month follow-up left breast ultrasound. The patient was informed
that if the mass continues to increase in size rather than
fluctuate, or if she would simply prefer biopsy, she should let her
doctor know and we can see her sooner.

I have discussed the findings and recommendations with the patient.
If applicable, a reminder letter will be sent to the patient
regarding the next appointment.

BI-RADS CATEGORY  3: Probably benign.

## 2022-09-13 ENCOUNTER — Encounter: Payer: Self-pay | Admitting: Family Medicine

## 2022-09-13 ENCOUNTER — Ambulatory Visit: Payer: Self-pay | Admitting: Family Medicine

## 2022-09-13 ENCOUNTER — Ambulatory Visit (LOCAL_COMMUNITY_HEALTH_CENTER): Payer: Self-pay | Admitting: Family Medicine

## 2022-09-13 VITALS — BP 90/55 | Ht 64.0 in | Wt 157.8 lb

## 2022-09-13 DIAGNOSIS — N6332 Unspecified lump in axillary tail of the left breast: Secondary | ICD-10-CM

## 2022-09-13 DIAGNOSIS — Z01419 Encounter for gynecological examination (general) (routine) without abnormal findings: Secondary | ICD-10-CM

## 2022-09-13 DIAGNOSIS — Z113 Encounter for screening for infections with a predominantly sexual mode of transmission: Secondary | ICD-10-CM

## 2022-09-13 DIAGNOSIS — Z3009 Encounter for other general counseling and advice on contraception: Secondary | ICD-10-CM

## 2022-09-13 LAB — WET PREP FOR TRICH, YEAST, CLUE
Trichomonas Exam: NEGATIVE
Yeast Exam: NEGATIVE

## 2022-09-13 LAB — HM HIV SCREENING LAB: HM HIV Screening: NEGATIVE

## 2022-09-13 NOTE — Progress Notes (Signed)
[facility] Number: [phone]  Family Planning Visit- Repeat Yearly Visit  Subjective:  Crystal Wright is a 27 y.o. [id number]  being seen today for an annual wellness visit and to discuss contraception options.   The patient is currently using Hormonal Implant for pregnancy prevention. Patient does not want a pregnancy in the next year.    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  Chief Complaint  Patient presents with   Annual Exam    PE and Pap smear    Patient reports to clinic for PE and pap smear  Patient denies concerns about self   See flowsheet for other program required questions.   Body mass index is 27.09 kg/m. - Patient is eligible for diabetes screening based on BMI> 25 and age Q000111Q?  not applicable Q000111Q ordered? not applicable  Patient reports 1 of partners in last year. Desires STI screening?  Yes   Has patient been screened once for HCV in the past?  No  No results found for: "HCVAB"  Does the patient have current of drug use, have a partner with drug use, and/or has been incarcerated since last result? No  If yes-- Screen for HCV through [facility]   Does the patient meet criteria for HBV testing? No  Criteria:  -Household, sexual or needle sharing contact with HBV -History of drug use -HIV positive -Those with known Hep C   Health Maintenance Due  Topic Date Due   HPV VACCINES (1 - 2-dose series) Never done   PAP-Cervical Cytology Screening  02/03/2022   PAP SMEAR-Modifier  02/03/2022   INFLUENZA VACCINE   02/07/2022   COVID-19 Vaccine (2 - 2023-24 season) 03/10/2022    Review of Systems  Constitutional:  Negative for weight loss.  Eyes:  Negative for blurred vision.  Respiratory:  Negative for cough and shortness of breath.   Cardiovascular:  Negative for claudication.  Gastrointestinal:  Negative for nausea.  Genitourinary:  Negative for dysuria and frequency.  Skin:  Negative for rash.  Neurological:  Negative for headaches.  Endo/Heme/Allergies:  Does not bruise/bleed easily.    The following portions of the patient's history were reviewed and updated as appropriate: allergies, current medications, past family history, past medical history, past social history, past surgical history and problem list. Problem list updated.  Objective:   Vitals:   09/13/22 0831  BP: (!) 90/55  Weight: 157 lb 12.8 oz (71.6 kg)  Height: '5\' 4"'$  (1.626 m)    Physical Exam Vitals and nursing note reviewed.  Constitutional:      Appearance: Normal appearance.  HENT:     Head: Normocephalic and atraumatic.     Mouth/Throat:     Mouth: Mucous membranes are moist.     Pharynx: Oropharynx is clear. No oropharyngeal exudate or posterior oropharyngeal erythema.  Pulmonary:     Effort: Pulmonary effort is normal.  Chest:  Breasts:    Tanner Score is 5.     Right: Normal.     Left: Mass present.       Comments: Pinpoint mass palpated in left breast as indicated per pictogram Abdominal:  General: Abdomen is flat.     Palpations: There is no mass.     Tenderness: There is no abdominal tenderness. There is no rebound.  Genitourinary:    General: Normal vulva.     Exam position: Lithotomy position.     Pubic Area: No rash or pubic lice.      Labia:        Right: No rash or lesion.        Left: No rash or lesion.      Vagina: Vaginal discharge present. No erythema, bleeding or lesions.     Cervix: Friability present. No cervical motion tenderness, discharge, lesion or erythema.     Uterus:  Normal.      Adnexa: Right adnexa normal and left adnexa normal.     Rectum: Normal.     Comments: pH = 4  Mild amt of white discharge present Lymphadenopathy:     Head:     Right side of head: No preauricular or posterior auricular adenopathy.     Left side of head: No preauricular or posterior auricular adenopathy.     Cervical: No cervical adenopathy.     Upper Body:     Right upper body: No supraclavicular, axillary or epitrochlear adenopathy.     Left upper body: No supraclavicular, axillary or epitrochlear adenopathy.     Lower Body: No right inguinal adenopathy. No left inguinal adenopathy.  Skin:    General: Skin is warm and dry.     Findings: No rash.  Neurological:     Mental Status: She is alert and oriented to person, place, and time.    Assessment and Plan:  Crystal Wright is a 27 y.o. female [id number] presenting to the [facility] Department for an yearly wellness and contraception visit   Contraception counseling: Reviewed options based on patient desire and reproductive life plan. Patient is interested in Hormonal Implant. This was not provided to the patient today. Implant already in place.   Risks, benefits, and typical effectiveness rates were reviewed.  Questions were answered.  Written information was also given to the patient to review.    The patient will follow up in  9 months for surveillance.  The patient was told to call with any further questions, or with any concerns about this method of contraception.  Emphasized use of condoms 100% of the time for STI prevention.  Patient was assessed for need for ECP. Not indicated. Implant in  place  1. Well woman exam with routine gynecological exam -repeat pap test today, has not had one since 2020 -no concerns about self today -pt has never been to a dentist, encouraged pt to go to dentist -pt c/o discomfort/pain during sexual intercourse- pt states it only occurs during one position, and does  not regularly use lubricant -encouraged lubrication, as well as changing positions if experiencing pain, no masses palpated on bimanual exam  - IGP, rfx Aptima HPV ASCU  2. Family planning -Pt originally came in for [place] removal- this device is good until 06/2023, pt would like to keep it, as she does not want a pregnancy. Will come back in December for exchange or removal.   3. Screening for venereal disease  - WET PREP FOR [place], YEAST, [place] [facility] - Syphilis Serology, [facility]  4. 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 -reprinted letter today in [place] and noted the phone number for her to call to schedule her f/u appointment for her fibroadenoma   Return in about 9 months (around 06/15/2023), or if symptoms worsen or fail to improve, for Nexplanon removal.  Future Appointments  Date Time Provider [place]  09/13/2022  9:05 AM Sharlet [place], [place], [place]

## 2022-09-13 NOTE — Progress Notes (Signed)
Pt is here for PE, Pap smear and STD screening.  Wet mount results reviewed, no treatment required per SO.  Pt will continue with Nexplanon.

## 2022-09-14 ENCOUNTER — Other Ambulatory Visit: Payer: Self-pay

## 2022-09-14 DIAGNOSIS — N6321 Unspecified lump in the left breast, upper outer quadrant: Secondary | ICD-10-CM

## 2022-09-19 LAB — IGP, RFX APTIMA HPV ASCU: PAP Smear Comment: 0

## 2022-09-25 ENCOUNTER — Ambulatory Visit: Payer: Self-pay

## 2022-10-02 ENCOUNTER — Ambulatory Visit: Payer: Self-pay | Attending: Hematology and Oncology | Admitting: Hematology and Oncology

## 2022-10-02 ENCOUNTER — Ambulatory Visit
Admission: RE | Admit: 2022-10-02 | Discharge: 2022-10-02 | Disposition: A | Discharge status: Home / Self Care | Payer: Self-pay | Source: Ambulatory Visit | Attending: Obstetrics and Gynecology | Admitting: Obstetrics and Gynecology

## 2022-10-02 VITALS — BP 99/68 | Wt 159.0 lb

## 2022-10-02 DIAGNOSIS — N6321 Unspecified lump in the left breast, upper outer quadrant: Secondary | ICD-10-CM | POA: Insufficient documentation

## 2022-10-02 NOTE — Patient Instructions (Signed)
Waldo about self breast awareness and gave educational materials to take home. Patient did not need a Pap smear today due to last Pap smear was in 2024 per patient.  Let her know [facility] will cover Pap smears every 5 years unless has a history of abnormal Pap smears. Referred [facility] to the [facility] for screening mammogram. Appointment scheduled for 10/02/22. Patient aware of appointment and will be there. Let patient know will follow up with her within the next couple weeks with results. [place] verbalized understanding.  Melodye Ped, NP 12:41 PM

## 2022-10-02 NOTE — Progress Notes (Signed)
Ms. Mareah Bobe is a 27 y.o. female who presents to [facility] clinic today with complaint of left breast mass.    Pap Smear: Pap not smear completed today. Last Pap smear was 2024 at [place] clinic and was normal. Per patient has no history of an abnormal Pap smear. Last Pap smear result is available in Epic.   Physical exam: Breasts Breasts symmetrical. No skin abnormalities bilateral breasts. No nipple retraction bilateral breasts. No nipple discharge bilateral breasts. No lymphadenopathy. No lumps palpated bilateral breasts.        Pelvic/Bimanual Pap is not indicated today    Smoking History: Patient has never smoked and was not referred to quit line.    Patient Navigation: Patient education provided. Access to services provided for patient through [place] interpreter provided. No transportation provided   Colorectal Cancer Screening: Per patient has never had colonoscopy completed No complaints today.    Breast and Cervical Cancer Risk Assessment: Patient does not have family history of breast cancer, known genetic mutations, or radiation treatment to the chest before age 70. Patient does not have history of cervical dysplasia, immunocompromised, or DES exposure in-utero.  Risk Assessment   No risk assessment data     A: [facility] exam without pap smear No complaints with benign exam. Follow up of probable benign fibroadenoma in the left breast.   P: Referred patient to the [facility] for a diagnostic mammogram. Appointment scheduled 10/02/22.  Melodye Ped, NP 10/02/2022 12:40 PM

## 2022-10-23 ENCOUNTER — Telehealth: Payer: Self-pay

## 2022-10-23 ENCOUNTER — Other Ambulatory Visit: Payer: Self-pay

## 2022-10-23 DIAGNOSIS — N6321 Unspecified lump in the left breast, upper outer quadrant: Secondary | ICD-10-CM

## 2022-10-23 NOTE — Telephone Encounter (Signed)
Error

## 2023-02-08 DIAGNOSIS — Z139 Encounter for screening, unspecified: Secondary | ICD-10-CM

## 2023-02-08 LAB — GLUCOSE, POCT (MANUAL RESULT ENTRY): POC Glucose: 158 mg/dl — AB (ref 70–99)

## 2023-02-08 NOTE — Congregational Nurse Program (Signed)
  Dept: [phone]   Congregational Nurse Program Note  Date of Encounter: 02/08/2023  Past Medical History: Past Medical History:  Diagnosis Date   Hx of urinary infection    treated for UTI in [place] this pregnancy (does not remember month)    Encounter Details:  CNP Questionnaire - 02/08/23 1628       Questionnaire   Ask client: Do you give verbal consent for me to treat you today? Yes    Student Assistance N/A    Location Patient Served  [facility]    Visit Setting with Client Organization    Patient Status Unknown    Insurance Unknown    Insurance/Financial Assistance Referral N/A    Medication N/A    Medical Provider Yes   [facility]   Screening Referrals Made N/A    Medical Referrals Made N/A    Medical Appointment Made N/A    Recently w/o PCP, now 1st time PCP visit completed due to CNs referral or appointment made N/A    Food N/A    Transportation N/A    Housing/Utilities N/A    Interpersonal Safety N/A    Interventions Navigate Healthcare System    Abnormal to Normal Screening Since Last CN Visit N/A    Screenings CN Performed Blood Pressure;Blood Glucose    Sent Client to Lab for: N/A    Did client attend any of the following based off CNs referral or appointments made? N/A    ED Visit Averted N/A    Life-Saving Intervention Made N/A            here for blood pressure and glucose screening.     02/08/2023    4:25 PM 10/02/2022   12:37 PM 09/13/2022    8:31 AM  Vitals with BMI  Height   5\' 4"   Weight  159 lbs 157 lbs 13 oz  BMI  27.28 27.07  Systolic 133 99 90  Diastolic 73 68 55

## 2023-06-26 ENCOUNTER — Ambulatory Visit
Admission: RE | Admit: 2023-06-26 | Discharge: 2023-06-26 | Disposition: A | Discharge status: Home / Self Care | Payer: Self-pay | Source: Ambulatory Visit | Attending: Obstetrics and Gynecology | Admitting: Obstetrics and Gynecology

## 2023-06-26 DIAGNOSIS — N6321 Unspecified lump in the left breast, upper outer quadrant: Secondary | ICD-10-CM | POA: Insufficient documentation

## 2023-09-14 ENCOUNTER — Ambulatory Visit: Payer: Self-pay

## 2023-09-17 ENCOUNTER — Ambulatory Visit: Payer: Self-pay | Admitting: Nurse Practitioner

## 2023-09-17 ENCOUNTER — Encounter: Payer: Self-pay | Admitting: Nurse Practitioner

## 2023-09-17 VITALS — BP 93/53 | HR 64 | Ht 64.0 in | Wt 161.0 lb

## 2023-09-17 DIAGNOSIS — Z30013 Encounter for initial prescription of injectable contraceptive: Secondary | ICD-10-CM

## 2023-09-17 DIAGNOSIS — Z113 Encounter for screening for infections with a predominantly sexual mode of transmission: Secondary | ICD-10-CM

## 2023-09-17 DIAGNOSIS — Z3049 Encounter for surveillance of other contraceptives: Secondary | ICD-10-CM

## 2023-09-17 DIAGNOSIS — Z3009 Encounter for other general counseling and advice on contraception: Secondary | ICD-10-CM

## 2023-09-17 DIAGNOSIS — Z01419 Encounter for gynecological examination (general) (routine) without abnormal findings: Secondary | ICD-10-CM

## 2023-09-17 LAB — WET PREP FOR TRICH, YEAST, CLUE
Trichomonas Exam: NEGATIVE
Yeast Exam: NEGATIVE

## 2023-09-17 LAB — HM HIV SCREENING LAB: HM HIV Screening: NEGATIVE

## 2023-09-17 MED ORDER — MEDROXYPROGESTERONE ACETATE 150 MG/ML IM SUSP
150.0000 mg | INTRAMUSCULAR | Status: AC
  Administered 2023-09-17: 150 mg via INTRAMUSCULAR

## 2023-09-17 NOTE — Progress Notes (Signed)
 Pt is here for family planning visit.  Family planning packet reviewed and given to pt.  Wet prep results reviewed, no treatment required per standing orders. Condoms given. Depo given in left deltoid and tolerated well.  Gaspar Garbe, RN

## 2023-09-27 ENCOUNTER — Encounter: Payer: Self-pay | Admitting: Nurse Practitioner

## 2023-09-27 NOTE — Progress Notes (Signed)
 Patient not seen at [facility] in person on 3/20. Patient seen at [facility] in person on 09/17/23.

## 2023-09-27 NOTE — Progress Notes (Signed)
 [employer] [facility] DEPARTMENT [facility] [address], [address] [place] [address] Main phone: [phone]  Family Planning Visit - Repeat Yearly Visit  Subjective:  Crystal Wright is a 28 y.o. [id number]  being seen today for an annual wellness visit and to discuss contraception options. The patient is currently using hormonal implant for pregnancy prevention. Patient does not want a pregnancy in the next year.   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 female who presents to the office today for PE, Nexplanon removal, to being hormonal injection as contraception and is requesting asymptomatic STI testing. Patient's only concern today is irregular menstrual bleeding and relates this to her hormonal implant and cites this being the reason she is wanting it removed.  In reviewing patient chart, she has a history of mass to the left breast, which she reports has resolved. She indicates a breast US that was normal.  Patient indicates 1 female partner in the last 12 months. She reports practicing vaginal sex and uses condoms sometimes. Patient indicates no STI history. Patient reports last sex was 09/15/23 (2 days ago). She indicates use of hormonal implant as contraception  method, which she would like removed today to begin Depo.  Patient indicates no LMP/irregular periods with Nexplanon.  Review of Systems  Constitutional:  Negative for weight loss.  HENT:  Negative for sore throat.   Eyes:  Negative for blurred vision.  Respiratory:  Negative for cough, shortness of breath and wheezing.   Cardiovascular:  Negative for chest pain and claudication.  Gastrointestinal:  Negative for nausea and vomiting.  Genitourinary:  Negative for dysuria and frequency.       Irregular vaginal bleeding.   Skin:  Negative for rash.  Neurological:  Negative for dizziness, seizures and headaches.  Endo/Heme/Allergies:  Does not bruise/bleed easily.    See flowsheet for other program required questions.   Diabetes screening This patient is 28 y.o. with a BMI of Body mass index is 27.64 kg/m.Marland Kitchen  Is patient eligible for diabetes screening (age >35 and BMI >25)?  no  Was Hgb A1c ordered? not applicable  STI screening Patient reports 1 of partners in last year.  Does this patient desire STI screening?  Yes  Hepatitis C screening Has patient been screened once for HCV in the past?  No  No results found for: "HCVAB"  Does the patient meet criteria for HCV testing? No  (If yes-- Screen for HCV through [facility]) Criteria:  Since the last HCV result, does the patient have any of the following? - Current drug use - Have a partner with drug use - Has been incarcerated  Hepatitis B screening Does the patient meet criteria for HBV testing? No Criteria:  -Household, sexual or needle sharing contact with HBV -History of drug use -  HIV positive -Those with known Hep C  Cervical Cancer Screening  Result Date Procedure Results Follow-ups  09/13/2022 IGP, rfx Aptima HPV ASCU DIAGNOSIS:: Comment Specimen adequacy:: Comment Clinician Provided ICD10: Comment Performed by:: Comment PAP Smear Comment: . Note:: Comment Test Methodology: Comment PAP Reflex: Comment    02/04/2019 Pap IG (Image Guided) DIAGNOSIS:: Comment Specimen adequacy:: Comment Clinician Provided ICD10: Comment Performed by:: Comment PAP Smear Comment: . Note:: Comment Test Methodology: Comment     Health Maintenance Due  Topic Date Due   HPV VACCINES (1 - 3-dose series) Never done   INFLUENZA VACCINE  02/08/2023   COVID-19 Vaccine (2 - 2024-25 season) 03/11/2023    The following portions of the patient's history were reviewed and updated as appropriate: allergies, current medications, past family history, past medical history, past social history, past surgical history and problem list. Problem list updated.  Objective:   Vitals:   09/17/23 1050  BP: (!) 93/53  Pulse: 64  Weight: 161 lb (73 kg)  Height: 5\' 4"  (1.626 m)    Physical Exam Vitals and nursing note reviewed. Exam conducted with a chaperone present Roddie Mc [place], [place] interpreter, present in room as chaperone.).  Constitutional:      Appearance: Normal appearance.  HENT:     Head: Normocephalic.     Salivary Glands: Right salivary gland is not diffusely enlarged or tender. Left salivary gland is not diffusely enlarged or tender.     Mouth/Throat:     Lips: Pink. No lesions.     Mouth: Mucous membranes are moist.     Tongue: No lesions. Tongue does not deviate from midline.     Pharynx: Oropharynx is clear. Uvula midline. No oropharyngeal exudate or posterior oropharyngeal erythema.     Tonsils: No tonsillar exudate.  Eyes:     General:        Right eye: No discharge.        Left eye: No discharge.  Neck:     Thyroid: No thyroid mass or thyroid tenderness.     Trachea: Trachea and phonation normal. No tracheal tenderness or tracheal deviation.  Cardiovascular:     Rate and Rhythm: Normal rate and regular rhythm.     Heart sounds: Normal heart sounds, S1 normal and S2 normal.  Pulmonary:     Effort: Pulmonary effort is normal.     Breath sounds: Normal breath sounds and air entry.  Chest:   Breasts:    Tanner Score is 5.     Breasts are symmetrical.     Right: Normal.     Left: Normal.  Abdominal:     General: Abdomen is flat. Bowel sounds are normal.     Palpations: Abdomen is soft.     Tenderness: There is no abdominal tenderness. There is no guarding or rebound.  Genitourinary:    General: Normal vulva.     Exam position: Lithotomy position.     Pubic Area: No rash or pubic lice.      Tanner stage (genital): 5.     Labia:        Right: No rash, tenderness, lesion or injury.        Left: No rash, tenderness, lesion or injury.      Vagina: Normal. No signs of injury and foreign body. No vaginal discharge, erythema, tenderness, bleeding or lesions.     Cervix: Normal. No cervical motion tenderness, discharge, friability, lesion, erythema, cervical bleeding or eversion.     Uterus: Normal.  Adnexa: Right adnexa normal and left adnexa normal.     Comments: pH<4.5  Lymphadenopathy:     Head:     Right side of head: No submental, submandibular, tonsillar, preauricular or posterior auricular adenopathy.     Left side of head: No submental, submandibular, tonsillar, preauricular or posterior auricular adenopathy.     Cervical: No cervical adenopathy.     Right cervical: No superficial or posterior cervical adenopathy.    Left cervical: No superficial or posterior cervical adenopathy.     Upper Body:     Right upper body: No supraclavicular or axillary adenopathy.     Left upper body: No supraclavicular or axillary adenopathy.     Lower Body: No right inguinal adenopathy. No left inguinal adenopathy.  Skin:    General: Skin is warm and dry.     Findings: No lesion or rash.          Comments: Skin tone appropriate for ethnicity.  Area indicated in diagram represents round, elevated, symmetrical, darkened (but uniform in color) possible mole to upper back. Area measures about 0.5cm in diameter.   Neurological:     Mental Status: She is alert and oriented to  person, place, and time.  Psychiatric:        Attention and Perception: Attention and perception normal.        Mood and Affect: Mood and affect normal.        Speech: Speech normal.        Behavior: Behavior normal. Behavior is cooperative.        Thought Content: Thought content normal.     Assessment and Plan:  Crystal Wright is a 29 y.o. female [id number] presenting to the [facility] Department for an yearly wellness and contraception visit  1. Family planning (Primary) Contraception counseling: Reviewed options based on patient desire and reproductive life plan. Patient is interested in Hormonal Injection. This was provided to the patient today.   Risks, benefits, and typical effectiveness rates were reviewed.  Questions were answered.  Written information was also given to the patient to review.    The patient will follow up in  3 months for surveillance.  The patient was told to call with any further questions, or with any concerns about this method of contraception.  Emphasized use of condoms 100% of the time for STI prevention.  Educated on ECP and assessed need for ECP. Patient was offered ECP based on Unprotected sex within past 72 hours.  Patient is within 2 days of unprotected sex. Patient was offered ECP. Reviewed options and patient desired No method of ECP, declined all    - medroxyPROGESTERone (DEPO-PROVERA) injection 150 mg  Nexplanon Removal Patient identified, informed consent performed, consent signed.   Appropriate time out taken. Nexplanon site identified.  Area prepped in usual sterile fashon. 3 ml of 1% lidocaine with Epinephrine was used to anesthetize the area at the distal end of the implant and along implant site. A small stab incision was made right beside the implant on the distal portion.  The Nexplanon rod was grasped using hemostats and removed without difficulty.  There was minimal blood loss. There were no complications.  Steri-strips were  applied over the small incision.  A pressure bandage was applied to reduce any bruising.  The patient tolerated the procedure well and was given post procedure instructions.     Counseled patient to take OTC analgesic starting as soon as lidocaine starts to wear off and take  regularly for at least 48 hr to decrease discomfort.  Specifically to take with food or milk to decrease stomach upset and for IB 600 mg (3 tablets) every 6 hrs; IB 800 mg (4 tablets) every 8 hrs; or Aleve 2 tablets every 12 hrs.   2. Well woman exam No PAP indicated.  CBE performed and not concerning. No follow-up indicated at this time. Repeat CBE in one year.  PCP list given to patient and encouraged patient to establish care.  Regarding possible mole to back, patient encouraged to seek evaluation with PCP who could refer to dermatologist if indicated. Kathreen Cosier, LCSW card given.  3. Screening for venereal disease Wet prep negative in office today.   - Chlamydia/Gonorrhea [facility] - HIV [place] [facility] - Syphilis Serology, [place] [facility] - WET PREP FOR TRICH, YEAST, CLUE  Return in about 3 months (around 12/18/2023) for depo injection.  No future appointments.  Due to language barrier, a Spanish interpreter Roddie Mc N. And language line [phone]) were present in person and by phone during the history-taking, subsequent discussion, and physical exam with this patient.   Edmonia James, NP

## 2024-06-12 ENCOUNTER — Ambulatory Visit: Payer: Self-pay

## 2024-06-12 VITALS — BP 109/54 | Ht 66.0 in | Wt 177.5 lb

## 2024-06-12 DIAGNOSIS — Z3201 Encounter for pregnancy test, result positive: Secondary | ICD-10-CM

## 2024-06-12 DIAGNOSIS — Z3009 Encounter for other general counseling and advice on contraception: Secondary | ICD-10-CM

## 2024-06-12 DIAGNOSIS — Z3200 Encounter for pregnancy test, result unknown: Secondary | ICD-10-CM

## 2024-06-12 LAB — PREGNANCY, URINE: Preg Test, Ur: POSITIVE — AB

## 2024-06-12 MED ORDER — PRENATAL 27-0.8 MG PO TABS: 1.0000 | ORAL_TABLET | Freq: Every day | ORAL | Status: AC

## 2024-06-12 NOTE — Progress Notes (Signed)
 UPT positive. Pt plans on prenatal care at [facility]. Sent to clerical for prenatal appt set up and presumptive eligibility.  Positive pregancy packet given and reviewed.  The patient was dispensed PNV #100 todayper S.O. Dr.Lynn. I provided counseling today regarding the medication. We discussed the medication, the side effects and when to call clinic. Patient given the opportunity to ask questions. Questions answered.

## 2024-08-12 ENCOUNTER — Ambulatory Visit: Payer: Self-pay

## 2024-08-12 VITALS — BP 93/61 | HR 71 | Temp 97.5°F | Wt 162.4 lb

## 2024-08-12 DIAGNOSIS — O0991 Supervision of high risk pregnancy, unspecified, first trimester: Secondary | ICD-10-CM

## 2024-08-12 DIAGNOSIS — Z23 Encounter for immunization: Secondary | ICD-10-CM

## 2024-08-12 DIAGNOSIS — Z3A13 13 weeks gestation of pregnancy: Secondary | ICD-10-CM

## 2024-08-12 DIAGNOSIS — O0990 Supervision of high risk pregnancy, unspecified, unspecified trimester: Secondary | ICD-10-CM | POA: Insufficient documentation

## 2024-08-12 LAB — HEMOGLOBIN, FINGERSTICK: Hemoglobin: 13.1 g/dL (ref 11.1–15.9)

## 2024-08-12 NOTE — Progress Notes (Cosign Needed)
 RN Portion only during today's visit on 08/12/24 will RTC for provider only portion.  Hardin Pouch, [facility]

## 2024-08-12 NOTE — Addendum Note (Signed)
 Addended by: TRUDY HARDIN RAMAN on: 08/12/2024 01:40 PM   Modules accepted: Level of Service

## 2024-08-12 NOTE — Progress Notes (Incomplete)
 Patient in for new ob history and labs. Due to delay secondary to weather, patient will come back to see provider for PE. Patient counseled to take Vitamin B 100 mg once daily with Unisom. Patient voiced understanding. Patient desires flu vaccine today, flu vaccine given and tolerated well. Language Crystal Wright, ID number [id number] used during vaccine administration. Sharman Lady RN.
